# Patient Record
Sex: Female | Born: 1980 | Hispanic: Yes | Marital: Married | Smoking: Never smoker
Health system: Southern US, Community
[De-identification: ages and names within clinical notes are randomized; demographics above are authoritative.]

## PROBLEM LIST (undated history)

## (undated) DIAGNOSIS — Q211 Atrial septal defect, unspecified: Secondary | ICD-10-CM

## (undated) DIAGNOSIS — K219 Gastro-esophageal reflux disease without esophagitis: Secondary | ICD-10-CM

## (undated) DIAGNOSIS — I639 Cerebral infarction, unspecified: Secondary | ICD-10-CM

## (undated) HISTORY — PX: ASD REPAIR: SHX258

---

## 2013-03-10 ENCOUNTER — Emergency Department (HOSPITAL_COMMUNITY): Payer: Medicaid Other

## 2013-03-10 ENCOUNTER — Encounter (HOSPITAL_COMMUNITY): Payer: Self-pay | Admitting: Emergency Medicine

## 2013-03-10 ENCOUNTER — Emergency Department (HOSPITAL_COMMUNITY)
Admission: EM | Admit: 2013-03-10 | Discharge: 2013-03-10 | Disposition: A | Discharge status: Home / Self Care | Payer: Medicaid Other | Attending: Emergency Medicine | Admitting: Emergency Medicine

## 2013-03-10 DIAGNOSIS — R1013 Epigastric pain: Secondary | ICD-10-CM | POA: Insufficient documentation

## 2013-03-10 DIAGNOSIS — R0602 Shortness of breath: Secondary | ICD-10-CM | POA: Insufficient documentation

## 2013-03-10 DIAGNOSIS — K8020 Calculus of gallbladder without cholecystitis without obstruction: Secondary | ICD-10-CM

## 2013-03-10 DIAGNOSIS — K8050 Calculus of bile duct without cholangitis or cholecystitis without obstruction: Secondary | ICD-10-CM

## 2013-03-10 DIAGNOSIS — R112 Nausea with vomiting, unspecified: Secondary | ICD-10-CM | POA: Insufficient documentation

## 2013-03-10 DIAGNOSIS — K219 Gastro-esophageal reflux disease without esophagitis: Secondary | ICD-10-CM | POA: Insufficient documentation

## 2013-03-10 DIAGNOSIS — Z8673 Personal history of transient ischemic attack (TIA), and cerebral infarction without residual deficits: Secondary | ICD-10-CM | POA: Insufficient documentation

## 2013-03-10 DIAGNOSIS — Z8679 Personal history of other diseases of the circulatory system: Secondary | ICD-10-CM | POA: Insufficient documentation

## 2013-03-10 DIAGNOSIS — R42 Dizziness and giddiness: Secondary | ICD-10-CM | POA: Insufficient documentation

## 2013-03-10 HISTORY — DX: Cerebral infarction, unspecified: I63.9

## 2013-03-10 HISTORY — DX: Gastro-esophageal reflux disease without esophagitis: K21.9

## 2013-03-10 HISTORY — DX: Atrial septal defect, unspecified: Q21.10

## 2013-03-10 HISTORY — DX: Atrial septal defect: Q21.1

## 2013-03-10 LAB — CBC WITH DIFFERENTIAL/PLATELET
BASOS PCT: 0 % (ref 0–1)
Basophils Absolute: 0 10*3/uL (ref 0.0–0.1)
EOS ABS: 0 10*3/uL (ref 0.0–0.7)
Eosinophils Relative: 0 % (ref 0–5)
HCT: 36.2 % (ref 36.0–46.0)
Hemoglobin: 12.5 g/dL (ref 12.0–15.0)
Lymphocytes Relative: 4 % — ABNORMAL LOW (ref 12–46)
Lymphs Abs: 0.5 10*3/uL — ABNORMAL LOW (ref 0.7–4.0)
MCH: 28.9 pg (ref 26.0–34.0)
MCHC: 34.5 g/dL (ref 30.0–36.0)
MCV: 83.6 fL (ref 78.0–100.0)
Monocytes Absolute: 0.7 10*3/uL (ref 0.1–1.0)
Monocytes Relative: 6 % (ref 3–12)
NEUTROS ABS: 10.2 10*3/uL — AB (ref 1.7–7.7)
Neutrophils Relative %: 89 % — ABNORMAL HIGH (ref 43–77)
PLATELETS: 218 10*3/uL (ref 150–400)
RBC: 4.33 MIL/uL (ref 3.87–5.11)
RDW: 13.3 % (ref 11.5–15.5)
WBC: 11.4 10*3/uL — AB (ref 4.0–10.5)

## 2013-03-10 LAB — COMPREHENSIVE METABOLIC PANEL
ALT: 17 U/L (ref 0–35)
AST: 17 U/L (ref 0–37)
Albumin: 3.5 g/dL (ref 3.5–5.2)
Alkaline Phosphatase: 63 U/L (ref 39–117)
BILIRUBIN TOTAL: 0.5 mg/dL (ref 0.3–1.2)
BUN: 13 mg/dL (ref 6–23)
CALCIUM: 8.2 mg/dL — AB (ref 8.4–10.5)
CO2: 22 meq/L (ref 19–32)
Chloride: 104 mEq/L (ref 96–112)
Creatinine, Ser: 0.7 mg/dL (ref 0.50–1.10)
Glucose, Bld: 112 mg/dL — ABNORMAL HIGH (ref 70–99)
Potassium: 3.7 mEq/L (ref 3.7–5.3)
Sodium: 141 mEq/L (ref 137–147)
Total Protein: 6.5 g/dL (ref 6.0–8.3)

## 2013-03-10 LAB — POCT I-STAT TROPONIN I: Troponin i, poc: 0 ng/mL (ref 0.00–0.08)

## 2013-03-10 LAB — LIPASE, BLOOD: LIPASE: 28 U/L (ref 11–59)

## 2013-03-10 MED ORDER — ONDANSETRON HCL 4 MG/2ML IJ SOLN
4.0000 mg | Freq: Once | INTRAMUSCULAR | Status: AC
  Administered 2013-03-10: 4 mg via INTRAVENOUS
  Filled 2013-03-10: qty 2

## 2013-03-10 MED ORDER — OMEPRAZOLE 20 MG PO CPDR: 20.0000 mg | DELAYED_RELEASE_CAPSULE | Freq: Every day | ORAL | Status: DC

## 2013-03-10 MED ORDER — METOCLOPRAMIDE HCL 10 MG PO TABS: 10.0000 mg | ORAL_TABLET | Freq: Four times a day (QID) | ORAL | Status: DC | PRN

## 2013-03-10 MED ORDER — TRAMADOL HCL 50 MG PO TABS: 50.0000 mg | ORAL_TABLET | Freq: Four times a day (QID) | ORAL | Status: DC | PRN

## 2013-03-10 MED ORDER — ASPIRIN 325 MG PO TABS
325.0000 mg | ORAL_TABLET | ORAL | Status: AC
  Administered 2013-03-10: 325 mg via ORAL
  Filled 2013-03-10: qty 1

## 2013-03-10 MED ORDER — MORPHINE SULFATE 4 MG/ML IJ SOLN
4.0000 mg | Freq: Once | INTRAMUSCULAR | Status: AC
  Administered 2013-03-10: 4 mg via INTRAVENOUS
  Filled 2013-03-10: qty 1

## 2013-03-10 NOTE — ED Notes (Signed)
Here by EMS from home, new to [place], use to live in [place], moved to [place] for 1 year. Husband out of town,  baby with pt (mother), baby bottle fed and breast fed. Pt here for CP, sob, nv, dizziness. Onset 2300. (denies fever or diarrhea).  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 Also h/o GERD with hospitalization for same aroudn time of pregnancy. Does not take any heart meds (or lasix). Dizziness and sob improved. Rates CP 8/10, NSL by EMS placed, zofran given. Pt pale, skin clammy. Last ate 2000 (bread, butter, milk). Pinpoints pain to R chest and epigastric mid upper abd.

## 2013-03-10 NOTE — Discharge Instructions (Signed)
Stay on a low fat diet until you see the surgeon.  Cholelithiasis Cholelithiasis (also called gallstones) is a form of gallbladder disease in which gallstones form in your gallbladder. The gallbladder is an organ that stores bile made in the liver, which helps digest fats. Gallstones begin as small crystals and slowly grow into stones. Gallstone pain occurs when the gallbladder spasms and a gallstone is blocking the duct. Pain can also occur when a stone passes out of the duct.  RISK FACTORS  Being female.   Having multiple pregnancies. Health care providers sometimes advise removing diseased gallbladders before future pregnancies.   Being obese.  Eating a diet heavy in fried foods and fat.   Being older than 16 years and increasing age.   Prolonged use of medicines containing female hormones.   Having diabetes mellitus.   Rapidly losing weight.   Having a family history of gallstones (heredity).  SYMPTOMS  Nausea.   Vomiting.  Abdominal pain.   Yellowing of the skin (jaundice).   Sudden pain. It may persist from several minutes to several hours.  Fever.   Tenderness to the touch. In some cases, when gallstones do not move into the bile duct, people have no pain or symptoms. These are called "silent" gallstones.  TREATMENT Silent gallstones do not need treatment. In severe cases, emergency surgery may be required. Options for treatment include:  Surgery to remove the gallbladder. This is the most common treatment.  Medicines. These do not always work and may take 6 12 months or more to work.  Shock wave treatment (extracorporeal biliary lithotripsy). In this treatment an ultrasound machine sends shock waves to the gallbladder to break gallstones into smaller pieces that can pass into the intestines or be dissolved by medicine. HOME CARE INSTRUCTIONS   Only take over-the-counter or prescription medicines for pain, discomfort, or fever as directed by your  health care provider.   Follow a low-fat diet until seen again by your health care provider. Fat causes the gallbladder to contract, which can result in pain.   Follow up with your health care provider as directed. Attacks are almost always recurrent and surgery is usually required for permanent treatment.  SEEK IMMEDIATE MEDICAL CARE IF:   Your pain increases and is not controlled by medicines.   You have a fever or persistent symptoms for more than 2 3 days.   You have a fever and your symptoms suddenly get worse.   You have persistent nausea and vomiting.  MAKE SURE YOU:   Understand these instructions.  Will watch your condition.  Will get help right away if you are not doing well or get worse. Document Released: 01/19/2005 Document Revised: 09/25/2012 Document Reviewed: 07/17/2012 [facility] Patient Information 2014 [place].  Tramadol tablets What is this medicine? TRAMADOL (TRA ma dole) is a pain reliever. It is used to treat moderate to severe pain in adults. This medicine may be used for other purposes; ask your health care provider or pharmacist if you have questions. COMMON BRAND NAME(S): Ultram What should I tell my health care provider before I take this medicine? They need to know if you have any of these conditions: -brain tumor -depression -drug abuse or addiction -head injury -if you frequently drink alcohol containing drinks -kidney disease or trouble passing urine -liver disease -lung disease, asthma, or breathing problems -seizures or epilepsy -suicidal thoughts, plans, or attempt; a previous suicide attempt by you or a family member -an unusual or allergic reaction to tramadol, codeine,  other medicines, foods, dyes, or preservatives -pregnant or trying to get pregnant -breast-feeding How should I use this medicine? Take this medicine by mouth with a full glass of water. Follow the directions on the prescription label. If the medicine  upsets your stomach, take it with food or milk. Do not take more medicine than you are told to take. Talk to your pediatrician regarding the use of this medicine in children. Special care may be needed. Overdosage: If you think you have taken too much of this medicine contact a poison control center or emergency room at once. NOTE: This medicine is only for you. Do not share this medicine with others. What if I miss a dose? If you miss a dose, take it as soon as you can. If it is almost time for your next dose, take only that dose. Do not take double or extra doses. What may interact with this medicine? Do not take this medicine with any of the following medications: -MAOIs like Carbex, Eldepryl, Marplan, Nardil, and Parnate This medicine may also interact with the following medications: -alcohol or medicines that contain alcohol -antihistamines -benzodiazepines -bupropion -carbamazepine or oxcarbazepine -clozapine -cyclobenzaprine -digoxin -furazolidone -linezolid -medicines for depression, anxiety, or psychotic disturbances -medicines for migraine headache like almotriptan, eletriptan, frovatriptan, naratriptan, rizatriptan, sumatriptan, zolmitriptan -medicines for pain like pentazocine, buprenorphine, butorphanol, meperidine, nalbuphine, and propoxyphene -medicines for sleep -muscle relaxants -naltrexone -phenobarbital -phenothiazines like perphenazine, thioridazine, chlorpromazine, mesoridazine, fluphenazine, prochlorperazine, promazine, and trifluoperazine -procarbazine -warfarin This list may not describe all possible interactions. Give your health care provider a list of all the medicines, herbs, non-prescription drugs, or dietary supplements you use. Also tell them if you smoke, drink alcohol, or use illegal drugs. Some items may interact with your medicine. What should I watch for while using this medicine? Tell your doctor or health care professional if your pain does not go  away, if it gets worse, or if you have new or a different type of pain. You may develop tolerance to the medicine. Tolerance means that you will need a higher dose of the medicine for pain relief. Tolerance is normal and is expected if you take this medicine for a long time. Do not suddenly stop taking your medicine because you may develop a severe reaction. Your body becomes used to the medicine. This does NOT mean you are addicted. Addiction is a behavior related to getting and using a drug for a non-medical reason. If you have pain, you have a medical reason to take pain medicine. Your doctor will tell you how much medicine to take. If your doctor wants you to stop the medicine, the dose will be slowly lowered over time to avoid any side effects. You may get drowsy or dizzy. Do not drive, use machinery, or do anything that needs mental alertness until you know how this medicine affects you. Do not stand or sit up quickly, especially if you are an older patient. This reduces the risk of dizzy or fainting spells. Alcohol can increase or decrease the effects of this medicine. Avoid alcoholic drinks. You may have constipation. Try to have a bowel movement at least every 2 to 3 days. If you do not have a bowel movement for 3 days, call your doctor or health care professional. Your mouth may get dry. Chewing sugarless gum or sucking hard candy, and drinking plenty of water may help. Contact your doctor if the problem does not go away or is severe. What side effects may I notice from receiving  this medicine? Side effects that you should report to your doctor or health care professional as soon as possible: -allergic reactions like skin rash, itching or hives, swelling of the face, lips, or tongue -breathing difficulties, wheezing -confusion -itching -light headedness or fainting spells -redness, blistering, peeling or loosening of the skin, including inside the mouth -seizures Side effects that usually do  not require medical attention (report to your doctor or health care professional if they continue or are bothersome): -constipation -dizziness -drowsiness -headache -nausea, vomiting This list may not describe all possible side effects. Call your doctor for medical advice about side effects. You may report side effects to FDA at 1-800-FDA-1088. Where should I keep my medicine? Keep out of the reach of children. Store at room temperature between 15 and 30 degrees C (59 and 86 degrees F). Keep container tightly closed. Throw away any unused medicine after the expiration date. NOTE: This sheet is a summary. It may not cover all possible information. If you have questions about this medicine, talk to your doctor, pharmacist, or health care provider.  2014, Elsevier/Gold Standard. (2009-10-06 11:55:44)  Metoclopramide tablets What is this medicine? METOCLOPRAMIDE (met oh kloe PRA mide) is used to treat the symptoms of gastroesophageal reflux disease (GERD) like heartburn. It is also used to treat people with slow emptying of the stomach and intestinal tract. This medicine may be used for other purposes; ask your health care provider or pharmacist if you have questions. COMMON BRAND NAME(S): Reglan What should I tell my health care provider before I take this medicine? They need to know if you have any of these conditions: -breast cancer -depression -diabetes -heart failure -high blood pressure -kidney disease -liver disease -Parkinson's disease or a movement disorder -pheochromocytoma -seizures -stomach obstruction, bleeding, or perforation -an unusual or allergic reaction to metoclopramide, procainamide, sulfites, other medicines, foods, dyes, or preservatives -pregnant or trying to get pregnant -breast-feeding How should I use this medicine? Take this medicine by mouth with a glass of water. Follow the directions on the prescription label. Take this medicine on an empty stomach,  about 30 minutes before eating. Take your doses at regular intervals. Do not take your medicine more often than directed. Do not stop taking except on the advice of your doctor or health care professional. A special MedGuide will be given to you by the pharmacist with each prescription and refill. Be sure to read this information carefully each time. Talk to your pediatrician regarding the use of this medicine in children. Special care may be needed. Overdosage: If you think you have taken too much of this medicine contact a poison control center or emergency room at once. NOTE: This medicine is only for you. Do not share this medicine with others. What if I miss a dose? If you miss a dose, take it as soon as you can. If it is almost time for your next dose, take only that dose. Do not take double or extra doses. What may interact with this medicine? -acetaminophen -cyclosporine -digoxin -medicines for blood pressure -medicines for diabetes, including insulin -medicines for hay fever and other allergies -medicines for depression, especially an Monoamine Oxidase Inhibitor (MAOI) -medicines for Parkinson's disease, like levodopa -medicines for sleep or for pain -tetracycline This list may not describe all possible interactions. Give your health care provider a list of all the medicines, herbs, non-prescription drugs, or dietary supplements you use. Also tell them if you smoke, drink alcohol, or use illegal drugs. Some items may interact with  your medicine. What should I watch for while using this medicine? It may take a few weeks for your stomach condition to start to get better. However, do not take this medicine for longer than 12 weeks. The longer you take this medicine, and the more you take it, the greater your chances are of developing serious side effects. If you are an elderly patient, a female patient, or you have diabetes, you may be at an increased risk for side effects from this  medicine. Contact your doctor immediately if you start having movements you cannot control such as lip smacking, rapid movements of the tongue, involuntary or uncontrollable movements of the eyes, head, arms and legs, or muscle twitches and spasms. Patients and their families should watch out for worsening depression or thoughts of suicide. Also watch out for any sudden or severe changes in feelings such as feeling anxious, agitated, panicky, irritable, hostile, aggressive, impulsive, severely restless, overly excited and hyperactive, or not being able to sleep. If this happens, especially at the beginning of treatment or after a change in dose, call your doctor. Do not treat yourself for high fever. Ask your doctor or health care professional for advice. You may get drowsy or dizzy. Do not drive, use machinery, or do anything that needs mental alertness until you know how this drug affects you. Do not stand or sit up quickly, especially if you are an older patient. This reduces the risk of dizzy or fainting spells. Alcohol can make you more drowsy and dizzy. Avoid alcoholic drinks. What side effects may I notice from receiving this medicine? Side effects that you should report to your doctor or health care professional as soon as possible: -allergic reactions like skin rash, itching or hives, swelling of the face, lips, or tongue -abnormal production of milk in females -breast enlargement in both males and females -change in the way you walk -difficulty moving, speaking or swallowing -drooling, lip smacking, or rapid movements of the tongue -excessive sweating -fever -involuntary or uncontrollable movements of the eyes, head, arms and legs -irregular heartbeat or palpitations -muscle twitches and spasms -unusually weak or tired Side effects that usually do not require medical attention (report to your doctor or health care professional if they continue or are bothersome): -change in sex drive or  performance -depressed mood -diarrhea -difficulty sleeping -headache -menstrual changes -restless or nervous This list may not describe all possible side effects. Call your doctor for medical advice about side effects. You may report side effects to FDA at 1-800-FDA-1088. Where should I keep my medicine? Keep out of the reach of children. Store at room temperature between 20 and 25 degrees C (68 and 77 degrees F). Protect from light. Keep container tightly closed. Throw away any unused medicine after the expiration date. NOTE: This sheet is a summary. It may not cover all possible information. If you have questions about this medicine, talk to your doctor, pharmacist, or health care provider.  2014, Elsevier/Gold Standard. (2011-05-23 13:04:38)

## 2013-03-10 NOTE — ED Notes (Signed)
Pt sleeping, baby sleeping, [facility] No changes.

## 2013-03-10 NOTE — ED Notes (Signed)
Husband returned call, aware of pt/ wife (& daughter) situation, he is in [place] about "2 hrs away", he is on the way here to ED.

## 2013-03-10 NOTE — ED Provider Notes (Signed)
CSN: [id number]     Arrival date & time 03/10/13  0127 History   First MD Initiated Contact with Patient 03/10/13 0241     Chief Complaint  Patient presents with  . Chest Pain  . Shortness of Breath  . Emesis  . Dizziness   (Consider location/radiation/quality/duration/timing/severity/associated sxs/prior Treatment) Patient is a 33 y.o. female presenting with chest pain, shortness of breath, vomiting, and dizziness. The history is provided by the patient.  Chest Pain Associated symptoms: dizziness, shortness of breath and vomiting   Shortness of Breath Associated symptoms: chest pain and vomiting   Emesis Dizziness Associated symptoms: chest pain, shortness of breath and vomiting   She noticed onset about 11:00 PM of epigastric pain with some radiation to back. Pain is dull and crampy. There is associated nausea and vomiting but no dyspnea or diaphoresis. Pain did not improve after vomiting. Pain was initially 10/10 but it has subsided to 6/10. She has not had pain like this before. She had eaten some bread with butter and milk about 3 hours before this started. She is 9 months postpartum.  Past Medical History  Diagnosis Date  . Gestational diabetes   . ASD (atrial septal defect)   . Stroke   . GERD (gastroesophageal reflux disease)    Past Surgical History  Procedure Laterality Date  . Asd repair     No family history on file. History  Substance Use Topics  . Smoking status: Never Smoker   . Smokeless tobacco: Not on file  . Alcohol Use: No   OB History   Grav Para Term Preterm Abortions TAB SAB Ect Mult Living                 Review of Systems  Respiratory: Positive for shortness of breath.   Cardiovascular: Positive for chest pain.  Gastrointestinal: Positive for vomiting.  Neurological: Positive for dizziness.  All other systems reviewed and are negative.    Allergies  Review of patient's allergies indicates no known allergies.  Home Medications  No  current outpatient prescriptions on file. BP 114/74  Pulse 60  Temp(Src) 99.1 F (37.3 C) (Oral)  Resp 12  SpO2 96%  LMP 02/24/2013 Physical Exam  Nursing note and vitals reviewed.  33 year old female, resting comfortably and in no acute distress. Vital signs are normal. Oxygen saturation is 96%, which is normal. Head is normocephalic and atraumatic. PERRLA, EOMI. Oropharynx is clear. Neck is nontender and supple without adenopathy or JVD. Back is nontender and there is no CVA tenderness. Lungs are clear without rales, wheezes, or rhonchi. Chest is nontender. Heart has regular rate and rhythm without murmur. Abdomen is soft, flat, with marked epigastric and right upper quadrant tenderness with positive Murphy's sign. There are no masses or hepatosplenomegaly and peristalsis is hypoactive. Extremities have no cyanosis or edema, full range of motion is present. Skin is warm and dry without rash. Neurologic: Mental status is normal, cranial nerves are intact, there are no motor or sensory deficits.   ED Course  Procedures (including critical care time) Labs Review Results for orders placed during the hospital encounter of 03/10/13  COMPREHENSIVE METABOLIC PANEL      Result Value Range   Sodium 141  137 - 147 mEq/L   Potassium 3.7  3.7 - 5.3 mEq/L   Chloride 104  96 - 112 mEq/L   CO2 22  19 - 32 mEq/L   Glucose, Bld 112 (*) 70 - 99 mg/dL   BUN 13  6 - 23 mg/dL   Creatinine, Ser 1.610.70  0.50 - 1.10 mg/dL   Calcium 8.2 (*) 8.4 - 10.5 mg/dL   Total Protein 6.5  6.0 - 8.3 g/dL   Albumin 3.5  3.5 - 5.2 g/dL   AST 17  0 - 37 U/L   ALT 17  0 - 35 U/L   Alkaline Phosphatase 63  39 - 117 U/L   Total Bilirubin 0.5  0.3 - 1.2 mg/dL   GFR calc non Af Amer >90  >90 mL/min   GFR calc Af Amer >90  >90 mL/min  CBC WITH DIFFERENTIAL      Result Value Range   WBC 11.4 (*) 4.0 - 10.5 K/uL   RBC 4.33  3.87 - 5.11 MIL/uL   Hemoglobin 12.5  12.0 - 15.0 g/dL   HCT 09.636.2  04.536.0 - 40.946.0 %   MCV 83.6   78.0 - 100.0 fL   MCH 28.9  26.0 - 34.0 pg   MCHC 34.5  30.0 - 36.0 g/dL   RDW 81.113.3  91.411.5 - 78.215.5 %   Platelets 218  150 - 400 K/uL   Neutrophils Relative % 89 (*) 43 - 77 %   Neutro Abs 10.2 (*) 1.7 - 7.7 K/uL   Lymphocytes Relative 4 (*) 12 - 46 %   Lymphs Abs 0.5 (*) 0.7 - 4.0 K/uL   Monocytes Relative 6  3 - 12 %   Monocytes Absolute 0.7  0.1 - 1.0 K/uL   Eosinophils Relative 0  0 - 5 %   Eosinophils Absolute 0.0  0.0 - 0.7 K/uL   Basophils Relative 0  0 - 1 %   Basophils Absolute 0.0  0.0 - 0.1 K/uL  LIPASE, BLOOD      Result Value Range   Lipase 28  11 - 59 U/L  POCT I-STAT TROPONIN I      Result Value Range   Troponin i, poc 0.00  0.00 - 0.08 ng/mL   Comment 3            Imaging Review [place] Abdomen Complete  03/10/2013   CLINICAL DATA:  Abdominal pain  EXAM: ULTRASOUND ABDOMEN COMPLETE  COMPARISON:  None.  FINDINGS: Gallbladder:  There is a 5 mm echogenic structure which is reportedly mobile in the gallbladder neck. Although not shadowing, the discrete appearance is consistent with stone. No wall thickening or sonographic Murphy sign.  Common bile duct:  Diameter: 4 mm  Liver:  No focal lesion identified. Within normal limits in parenchymal echogenicity.  IVC:  No abnormality visualized.  Pancreas:  Not visualized due to bowel gas  Spleen:  Size and appearance within normal limits.  Right Kidney:  Length: 10 cm. Echogenicity within normal limits. No mass or hydronephrosis visualized.  Left Kidney:  Length: 10 cm. Echogenicity within normal limits. No mass or hydronephrosis visualized.  Abdominal aorta:  No aneurysm visualized.  Other findings:  None.  IMPRESSION: Cholelithiasis without acute cholecystitis.   Electronically Signed   By: Tiburcio PeaJonathan  Watts M.D.   On: 03/10/2013 04:50   Dg Chest Port 1 View  03/10/2013   CLINICAL DATA:  Chest pain, shortness of breath, nausea and vomiting.  EXAM: PORTABLE CHEST - 1 VIEW  COMPARISON:  None.  FINDINGS: Normal heart size and mediastinal  contours. No acute infiltrate or edema. No effusion or pneumothorax. No acute osseous findings.  IMPRESSION: Normal chest radiograph.   Electronically Signed   By: Tiburcio PeaJonathan  Watts M.D.   On: 03/10/2013  02:46   Images viewed by me.  MDM   1. Biliary colic   2. Cholelithiasis    Epigastric and right upper quadrant pain which seems most likely to be biliary colic. She is in the postpartum state which is at time when biliary colic is frequently a problem. Because she is currently breast-feeding, she was offered the option of narcotic pain medication with the provision that she would have to pump and discard milk for for 24 hours. She is currently deciding not to take pain medication. She will be sent for ultrasound of her abdomen. She has no prior records and the [place] system.  Ultrasound confirms a small stone in the gallbladder and this appears to be what is the source of her pain. She is referred to [facility] [place] [facility] for followup and is discharged with instructions in the low fat diet and prescriptions are given for metoclopramide for nausea and tramadol for pain. Advised that she should not breast feed for 24 hours after taking a narcotic.  Dione Booze, MD 03/10/13 0500

## 2013-03-10 NOTE — ED Notes (Signed)
US i to room for BS portable US.

## 2013-03-10 NOTE — ED Notes (Signed)
Pain is worse, pt tearful, lying on R lateral recumbent, NAD. Husband's cell phone called x2, message left x2, awaiting call back. Pt notified. Baby sleeping next to mother. [facility]IR. [facility]

## 2013-03-10 NOTE — ED Notes (Signed)
US tech contacted, [facility] US will be ~ 45 minutes, staff and mother attempting to console baby. Pt declined pain med at this time.

## 2013-03-10 NOTE — ED Notes (Signed)
Baby sleeping w/pt  Pt aware that husband is on the way. Pt states that  She is worried that she will get home and then have to come back and get admitted agagin like last time

## 2013-03-10 NOTE — ED Notes (Signed)
Dr. Preston FleetingGlick into room, baby crying, NS holding.

## 2013-05-08 ENCOUNTER — Emergency Department (HOSPITAL_COMMUNITY): Payer: Medicaid Other

## 2013-05-08 ENCOUNTER — Emergency Department (HOSPITAL_COMMUNITY)
Admission: EM | Admit: 2013-05-08 | Discharge: 2013-05-08 | Disposition: A | Discharge status: Home / Self Care | Payer: Medicaid Other | Attending: Emergency Medicine | Admitting: Emergency Medicine

## 2013-05-08 ENCOUNTER — Encounter (HOSPITAL_COMMUNITY): Payer: Self-pay | Admitting: Emergency Medicine

## 2013-05-08 DIAGNOSIS — Z3202 Encounter for pregnancy test, result negative: Secondary | ICD-10-CM | POA: Insufficient documentation

## 2013-05-08 DIAGNOSIS — Z8632 Personal history of gestational diabetes: Secondary | ICD-10-CM | POA: Insufficient documentation

## 2013-05-08 DIAGNOSIS — Z8719 Personal history of other diseases of the digestive system: Secondary | ICD-10-CM | POA: Insufficient documentation

## 2013-05-08 DIAGNOSIS — Z8774 Personal history of (corrected) congenital malformations of heart and circulatory system: Secondary | ICD-10-CM | POA: Insufficient documentation

## 2013-05-08 DIAGNOSIS — R1011 Right upper quadrant pain: Secondary | ICD-10-CM | POA: Insufficient documentation

## 2013-05-08 DIAGNOSIS — Z8673 Personal history of transient ischemic attack (TIA), and cerebral infarction without residual deficits: Secondary | ICD-10-CM | POA: Insufficient documentation

## 2013-05-08 DIAGNOSIS — R1013 Epigastric pain: Secondary | ICD-10-CM

## 2013-05-08 LAB — URINALYSIS, ROUTINE W REFLEX MICROSCOPIC
BILIRUBIN URINE: NEGATIVE
Glucose, UA: NEGATIVE mg/dL
HGB URINE DIPSTICK: NEGATIVE
Ketones, ur: NEGATIVE mg/dL
Leukocytes, UA: NEGATIVE
NITRITE: NEGATIVE
Protein, ur: NEGATIVE mg/dL
SPECIFIC GRAVITY, URINE: 1.025 (ref 1.005–1.030)
Urobilinogen, UA: 0.2 mg/dL (ref 0.0–1.0)
pH: 6 (ref 5.0–8.0)

## 2013-05-08 LAB — CBC WITH DIFFERENTIAL/PLATELET
BASOS PCT: 0 % (ref 0–1)
Basophils Absolute: 0 10*3/uL (ref 0.0–0.1)
EOS ABS: 0.1 10*3/uL (ref 0.0–0.7)
Eosinophils Relative: 1 % (ref 0–5)
HCT: 36.7 % (ref 36.0–46.0)
Hemoglobin: 12.6 g/dL (ref 12.0–15.0)
Lymphocytes Relative: 7 % — ABNORMAL LOW (ref 12–46)
Lymphs Abs: 1 10*3/uL (ref 0.7–4.0)
MCH: 29.1 pg (ref 26.0–34.0)
MCHC: 34.3 g/dL (ref 30.0–36.0)
MCV: 84.8 fL (ref 78.0–100.0)
Monocytes Absolute: 1.3 10*3/uL — ABNORMAL HIGH (ref 0.1–1.0)
Monocytes Relative: 9 % (ref 3–12)
NEUTROS PCT: 83 % — AB (ref 43–77)
Neutro Abs: 11.1 10*3/uL — ABNORMAL HIGH (ref 1.7–7.7)
Platelets: 207 10*3/uL (ref 150–400)
RBC: 4.33 MIL/uL (ref 3.87–5.11)
RDW: 13.4 % (ref 11.5–15.5)
WBC: 13.4 10*3/uL — ABNORMAL HIGH (ref 4.0–10.5)

## 2013-05-08 LAB — COMPREHENSIVE METABOLIC PANEL
ALBUMIN: 3.4 g/dL — AB (ref 3.5–5.2)
ALK PHOS: 65 U/L (ref 39–117)
ALT: 23 U/L (ref 0–35)
AST: 16 U/L (ref 0–37)
BUN: 17 mg/dL (ref 6–23)
CO2: 23 mEq/L (ref 19–32)
Calcium: 8.9 mg/dL (ref 8.4–10.5)
Chloride: 102 mEq/L (ref 96–112)
Creatinine, Ser: 0.64 mg/dL (ref 0.50–1.10)
GFR calc Af Amer: >90 mL/min (ref >90)
GFR calc non Af Amer: >90 mL/min (ref >90)
Glucose, Bld: 104 mg/dL — ABNORMAL HIGH (ref 70–99)
POTASSIUM: 3.8 meq/L (ref 3.7–5.3)
SODIUM: 138 meq/L (ref 137–147)
Total Bilirubin: 0.3 mg/dL (ref 0.3–1.2)
Total Protein: 6.6 g/dL (ref 6.0–8.3)

## 2013-05-08 LAB — LIPASE, BLOOD: Lipase: 25 U/L (ref 11–59)

## 2013-05-08 LAB — PREGNANCY, URINE: Preg Test, Ur: NEGATIVE

## 2013-05-08 MED ORDER — HYDROCODONE-ACETAMINOPHEN 5-325 MG PO TABS: 1.0000 | ORAL_TABLET | Freq: Four times a day (QID) | ORAL | Status: DC | PRN

## 2013-05-08 MED ORDER — IOHEXOL 300 MG/ML  SOLN
100.0000 mL | Freq: Once | INTRAMUSCULAR | Status: AC | PRN
  Administered 2013-05-08: 100 mL via INTRAVENOUS

## 2013-05-08 MED ORDER — MORPHINE SULFATE 4 MG/ML IJ SOLN
4.0000 mg | Freq: Once | INTRAMUSCULAR | Status: AC
  Administered 2013-05-08: 4 mg via INTRAVENOUS
  Filled 2013-05-08: qty 1

## 2013-05-08 MED ORDER — IOHEXOL 300 MG/ML  SOLN
25.0000 mL | Freq: Once | INTRAMUSCULAR | Status: AC | PRN
  Administered 2013-05-08: 25 mL via ORAL

## 2013-05-08 MED ORDER — PANTOPRAZOLE SODIUM 40 MG PO TBEC
40.0000 mg | DELAYED_RELEASE_TABLET | Freq: Once | ORAL | Status: AC
  Administered 2013-05-08: 40 mg via ORAL
  Filled 2013-05-08: qty 1

## 2013-05-08 MED ORDER — FAMOTIDINE IN NACL 20-0.9 MG/50ML-% IV SOLN
20.0000 mg | INTRAVENOUS | Status: AC
  Administered 2013-05-08: 20 mg via INTRAVENOUS
  Filled 2013-05-08: qty 50

## 2013-05-08 MED ORDER — RANITIDINE HCL 150 MG PO TABS: 150.0000 mg | ORAL_TABLET | Freq: Two times a day (BID) | ORAL | Status: DC

## 2013-05-08 MED ORDER — GI COCKTAIL ~~LOC~~
30.0000 mL | Freq: Once | ORAL | Status: AC
  Administered 2013-05-08: 30 mL via ORAL
  Filled 2013-05-08: qty 30

## 2013-05-08 MED ORDER — PANTOPRAZOLE SODIUM 40 MG IV SOLR
40.0000 mg | INTRAVENOUS | Status: AC
  Administered 2013-05-08: 40 mg via INTRAVENOUS
  Filled 2013-05-08: qty 40

## 2013-05-08 MED ORDER — ONDANSETRON HCL 4 MG/2ML IJ SOLN
4.0000 mg | Freq: Once | INTRAMUSCULAR | Status: AC
  Administered 2013-05-08: 4 mg via INTRAVENOUS
  Filled 2013-05-08: qty 2

## 2013-05-08 NOTE — ED Notes (Signed)
Per EMS- patient c/o R abdominal pain, already diagnosed with gallbladder issues but patient did not want it out. States she does not want it out at current time either due to child's birthday on Friday. 30 mg Toradol, 4 of Zofran given in route.

## 2013-05-08 NOTE — ED Notes (Signed)
Patient requesting to hold off on nausea medication

## 2013-05-08 NOTE — ED Provider Notes (Signed)
CSN: [id number]     Arrival date & time 05/08/13  0044 History   First MD Initiated Contact with Patient 05/08/13 0118     Chief Complaint  Patient presents with  . Abdominal Pain     (Consider location/radiation/quality/duration/timing/severity/associated sxs/prior Treatment) HPI Comments: Who presents with a complaint of abdominal discomfort. She states that this started earlier this evening approximately 7 hours ago and has been persistent, located in the epigastrium in the mid lower chest with some radiation to the right upper quadrant. She has had some nausea but no vomiting, no radiation to the back. It does get worse with eating, it does get worse with laying supine. She has no fevers chills coughing shortness of breath and has no back pain swelling of the legs dysuria or diarrhea. She was diagnosed with cholelithiasis approximately 6 weeks ago by ultrasound, no signs of cholecystitis with normal blood work at that time. She has not had any symptoms since being seen in the emergency department in February until this evening. He does not drink alcohol, she has had nothing but cereal and salad over the last 14 hours.  Patient is a 33 y.o. female presenting with abdominal pain. The history is provided by the patient and medical records.  Abdominal Pain   Past Medical History  Diagnosis Date  . Gestational diabetes   . ASD (atrial septal defect)   . Stroke   . GERD (gastroesophageal reflux disease)    Past Surgical History  Procedure Laterality Date  . Asd repair     History reviewed. No pertinent family history. History  Substance Use Topics  . Smoking status: Never Smoker   . Smokeless tobacco: Not on file  . Alcohol Use: No   OB History   Grav Para Term Preterm Abortions TAB SAB Ect Mult Living                 Review of Systems  Gastrointestinal: Positive for abdominal pain.  All other systems reviewed and are negative.      Allergies  Peanut butter flavor  Home  Medications  No current outpatient prescriptions on file. BP 127/61  Pulse 92  Temp(Src) 97.6 F (36.4 C) (Oral)  Resp 20  Wt 170 lb (77.111 kg)  SpO2 95%  LMP 04/21/2013 Physical Exam  Nursing note and vitals reviewed. Constitutional: She appears well-developed and well-nourished. No distress.  HENT:  Head: Normocephalic and atraumatic.  Mouth/Throat: Oropharynx is clear and moist. No oropharyngeal exudate.  Eyes: Conjunctivae and EOM are normal. Pupils are equal, round, and reactive to light. Right eye exhibits no discharge. Left eye exhibits no discharge. No scleral icterus.  Neck: Normal range of motion. Neck supple. No JVD present. No thyromegaly present.  Cardiovascular: Normal rate, regular rhythm, normal heart sounds and intact distal pulses.  Exam reveals no gallop and no friction rub.   No murmur heard. Pulmonary/Chest: Effort normal and breath sounds normal. No respiratory distress. She has no wheezes. She has no rales.  Abdominal: Soft. Bowel sounds are normal. She exhibits no distension and no mass. There is tenderness ( mild RUQ and epigastric ttp, no guarding, normal BS).  Musculoskeletal: Normal range of motion. She exhibits no edema and no tenderness.  Lymphadenopathy:    She has no cervical adenopathy.  Neurological: She is alert. Coordination normal.  Skin: Skin is warm and dry. No rash noted. No erythema.  Psychiatric: She has a normal mood and affect. Her behavior is normal.    ED Course  Procedures (including critical care time) Labs Review Labs Reviewed  CBC WITH DIFFERENTIAL  COMPREHENSIVE METABOLIC PANEL  LIPASE, BLOOD  URINALYSIS, ROUTINE W REFLEX MICROSCOPIC  PREGNANCY, URINE   Imaging Review No results found.   EKG Interpretation None      MDM   Final diagnoses:  None    Well appearing female, labs show leukocytosis of 13,400, lipase is normal, liver function is normal, electrolytes are normal. Ultrasound pending  Change of shift -  care signed out to Dr. Gwendolyn GrantWalden.     Vida RollerBrian D Cesia Orf, MD 05/09/13 [phone]

## 2013-05-08 NOTE — ED Notes (Signed)
Ultrasound still at bedside. Pt has not voided yet.

## 2013-05-08 NOTE — ED Notes (Signed)
Ultrasound at bedside

## 2013-05-08 NOTE — ED Notes (Signed)
Patient requesting nausea medication 

## 2013-05-08 NOTE — ED Notes (Signed)
Patient still drinking contrast.

## 2013-05-08 NOTE — ED Provider Notes (Signed)
80F with epigastric pain. Normal labs. Unable to control pain with GI cocktail, PPI. Dr. Hyacinth MeekerMiller ordered CT scan, awaiting results. CT negative for acute findings. Will discharge on H2-blocker and PPI.   1. Epigastric pain    I have reviewed all labs and imaging and considered them in my medical decision making.  CT Abdomen Pelvis W Contrast (Final result)  Result time: 05/08/13 09:35:32    Final result by Rad Results In Interface (05/08/13 09:35:32)    Narrative:   CLINICAL DATA: Abdominal pain  EXAM: CT ABDOMEN AND PELVIS WITH CONTRAST  TECHNIQUE: Multidetector CT imaging of the abdomen and pelvis was performed using the standard protocol following bolus administration of intravenous contrast.  CONTRAST: 100mL OMNIPAQUE IOHEXOL 300 MG/ML SOLN  COMPARISON: Ultrasound abdomen 05/08/2013  FINDINGS: Lung bases are clear. Heart size is within normal limits.  Liver gallbladder and bile ducts are normal. Pancreas spleen and kidneys are normal.  Negative for bowel obstruction. Normal appendix. Sigmoid diverticulosis without evidence of diverticulitis. Minimal free fluid. Corpus luteum cyst on the left. 1 cm enhancing solid uterine mass consistent with leiomyoma in the anterior fundus. No acute bony findings.  IMPRESSION: Sigmoid diverticulosis.  Normal appendix.  Corpus luteum cyst left adnexa.   Electronically Signed By: Marlan Palauharles Clark M.D. On: 05/08/2013 09:35      Dagmar HaitWilliam Exa Bomba, MD 05/08/13 1012

## 2013-05-08 NOTE — Discharge Instructions (Signed)
Abdominal Pain, Women °Abdominal (stomach, pelvic, or belly) pain can be caused by many things. It is important to tell your doctor: °· The location of the pain. °· Does it come and go or is it present all the time? °· Are there things that start the pain (eating certain foods, exercise)? °· Are there other symptoms associated with the pain (fever, nausea, vomiting, diarrhea)? °All of this is helpful to know when trying to find the cause of the pain. °CAUSES  °· Stomach: virus or bacteria infection, or ulcer. °· Intestine: appendicitis (inflamed appendix), regional ileitis (Crohn's disease), ulcerative colitis (inflamed colon), irritable bowel syndrome, diverticulitis (inflamed diverticulum of the colon), or cancer of the stomach or intestine. °· Gallbladder disease or stones in the gallbladder. °· Kidney disease, kidney stones, or infection. °· Pancreas infection or cancer. °· Fibromyalgia (pain disorder). °· Diseases of the female organs: °· Uterus: fibroid (non-cancerous) tumors or infection. °· Fallopian tubes: infection or tubal pregnancy. °· Ovary: cysts or tumors. °· Pelvic adhesions (scar tissue). °· Endometriosis (uterus lining tissue growing in the pelvis and on the pelvic organs). °· Pelvic congestion syndrome (female organs filling up with blood just before the menstrual period). °· Pain with the menstrual period. °· Pain with ovulation (producing an egg). °· Pain with an IUD (intrauterine device, birth control) in the uterus. °· Cancer of the female organs. °· Functional pain (pain not caused by a disease, may improve without treatment). °· Psychological pain. °· Depression. °DIAGNOSIS  °Your doctor will decide the seriousness of your pain by doing an examination. °· Blood tests. °· X-rays. °· Ultrasound. °· CT scan (computed tomography, special type of X-ray). °· MRI (magnetic resonance imaging). °· Cultures, for infection. °· Barium enema (dye inserted in the large intestine, to better view it with  X-rays). °· Colonoscopy (looking in intestine with a lighted tube). °· Laparoscopy (minor surgery, looking in abdomen with a lighted tube). °· Major abdominal exploratory surgery (looking in abdomen with a large incision). °TREATMENT  °The treatment will depend on the cause of the pain.  °· Many cases can be observed and treated at home. °· Over-the-counter medicines recommended by your caregiver. °· Prescription medicine. °· Antibiotics, for infection. °· Birth control pills, for painful periods or for ovulation pain. °· Hormone treatment, for endometriosis. °· Nerve blocking injections. °· Physical therapy. °· Antidepressants. °· Counseling with a psychologist or psychiatrist. °· Minor or major surgery. °HOME CARE INSTRUCTIONS  °· Do not take laxatives, unless directed by your caregiver. °· Take over-the-counter pain medicine only if ordered by your caregiver. Do not take aspirin because it can cause an upset stomach or bleeding. °· Try a clear liquid diet (broth or water) as ordered by your caregiver. Slowly move to a bland diet, as tolerated, if the pain is related to the stomach or intestine. °· Have a thermometer and take your temperature several times a day, and record it. °· Bed rest and sleep, if it helps the pain. °· Avoid sexual intercourse, if it causes pain. °· Avoid stressful situations. °· Keep your follow-up appointments and tests, as your caregiver orders. °· If the pain does not go away with medicine or surgery, you may try: °· Acupuncture. °· Relaxation exercises (yoga, meditation). °· Group therapy. °· Counseling. °SEEK MEDICAL CARE IF:  °· You notice certain foods cause stomach pain. °· Your home care treatment is not helping your pain. °· You need stronger pain medicine. °· You want your IUD removed. °· You feel faint or   lightheaded. °· You develop nausea and vomiting. °· You develop a rash. °· You are having side effects or an allergy to your medicine. °SEEK IMMEDIATE MEDICAL CARE IF:  °· Your  pain does not go away or gets worse. °· You have a fever. °· Your pain is felt only in portions of the abdomen. The right side could possibly be appendicitis. The left lower portion of the abdomen could be colitis or diverticulitis. °· You are passing blood in your stools (bright red or black tarry stools, with or without vomiting). °· You have blood in your urine. °· You develop chills, with or without a fever. °· You pass out. °MAKE SURE YOU:  °· Understand these instructions. °· Will watch your condition. °· Will get help right away if you are not doing well or get worse. °Document Released: 11/20/2006 Document Revised: 04/17/2011 Document Reviewed: 12/10/2008 °ExitCare® Patient Information ©2014 [employer]. ° °Emergency Department Resource Guide °1) Find a Doctor and Pay Out of Pocket °Although you won't have to find out who is covered by your insurance plan, it is a good idea to ask around and get recommendations. You will then need to call the office and see if the doctor you have chosen will accept you as a new patient and what types of options they offer for patients who are self-pay. Some doctors offer discounts or will set up payment plans for their patients who do not have insurance, but you will need to ask so you aren't surprised when you get to your appointment. ° °2) Contact Your Local Health Department °Not all health departments have doctors that can see patients for sick visits, but many do, so it is worth a call to see if yours does. If you don't know where your local health department is, you can check in your phone book. The CDC also has a tool to help you locate your state's health department, and many state websites also have listings of all of their local health departments. ° °3) Find a Walk-in Clinic °If your illness is not likely to be very severe or complicated, you may want to try a walk in clinic. These are popping up all over the country in pharmacies, drugstores, and shopping  centers. They're usually staffed by nurse practitioners or physician assistants that have been trained to treat common illnesses and complaints. They're usually fairly quick and inexpensive. However, if you have serious medical issues or chronic medical problems, these are probably not your best option. ° °No Primary Care Doctor: °- Call [facility] at  832-8000 - they can help you locate a primary care doctor that  accepts your insurance, provides certain services, etc. °- Physician Referral Service- 1-800-533-3463 ° °Chronic Pain Problems: °Organization         Address  Phone   Notes  °[facility]  (336) 297-2271 Patients need to be referred by their primary care doctor.  ° °Medication Assistance: °Organization         Address  Phone   Notes  °[place] [facility] [address] °[place], [place] [address] (336) 641-8030 --Must be a resident of [place] °-- Must have NO insurance coverage whatsoever (no Medicaid/ Medicare, etc.) °-- The pt. MUST have a primary care doctor that directs their care regularly and follows them in the community °  °[facility]  (866) 331-1348   °[employer]  (888) 892-1162   ° °Agencies that provide inexpensive medical care: °Organization           Address  Phone   Notes  °[facility]  (336) 832-8035   °[facility]    (336) 832-7272   °[facility] [address] °[place], [place] [address] (336) 832-4777   °[facility] [address], °[place] (336) 271-4999   °[facility]    (336) 373-0678   °[facility]    (336) 272-1050   °[facility] ° [address], [place] Phone:  (336) 832-4444, Fax:  (336) 832-4440 Hours of Operation:  9 am - 6 pm, M-F.  Also accepts Medicaid/Medicare and self-pay.  °[facility] ° [address], [place] Phone: (336) 832-3150, Fax: (336)  832-3151. Hours of Operation:  8:30 am - 5:30 pm, M-F.  Also accepts Medicaid and self-pay.  °[facility] [place] [address], [place] Phone: (336) 878-6027   °[facility] [address], [place], [place] (336)723-1848, Ext. 123 Mondays & Thursdays: 7-9 AM.  First 15 patients are seen on a first come, first serve basis. °  ° °Medicaid-accepting [place] Providers: ° °Organization         Address  Phone   Notes  °[facility] [address], [place] (336) 641-2100 Also accepts self-pay patients.  °[facility] [address], [place] ° (336) 856-9996   °[facility] [address], [place] (336) 288-8857   °[facility] [address] [place] [address], [place] (336) 299-7000   °Veita Bland [address], [place]  ° (336) 373-1557 Only accepts [place] Access Medicaid patients after they have their name applied to their card.  ° °Self-Pay (no insurance) in [place]: ° °Organization         Address  Phone   Notes  °Sickle Cell Patients, [facility] [address], [place] (336) 832-1970   °[facility] [address], [place] (336) 832-4400   °[facility] ° [address], [place] (336) 992-4800   °[facility]/Dr. Osei-Bonsu ° [address] [place] [address], [place] or [address], [place] (336) 841-8500 Phone number for both [place] and [place] locations is the same.  °[facility] [address], [place] (336) 299-0000   °[facility] [address] [place] [address], [place] or [address] (336) 852-7530 °(336) 878-2260   °[facility] [address], [place] (336) 350-1642, phone; (336) 294-5005, fax Sees patients 1st and 3rd Saturday of every month.  Must not qualify for public or private insurance (i.e.  Medicaid, Medicare, [place] Health Choice, Veterans' Benefits) • Household income should be no more than 200% of the poverty level •The clinic cannot treat you if you are pregnant or think you are pregnant • Sexually transmitted diseases are not treated at the clinic.  ° ° °Dental Care: °Organization         Address  Phone  Notes  °[facility] [address],  (336) 641-6152 Accepts children up to age 21 who are enrolled in Medicaid or [place] Health Choice; pregnant women with a Medicaid card; and children who have applied for Medicaid or  Health Choice, but were declined, whose parents can pay a reduced fee at time   of service.  °[facility] [place]  [address], [place] (336) 641-7733 Accepts children up to age 21 who are enrolled in Medicaid or [place] Health Choice; pregnant women with a Medicaid card; and children who have applied for Medicaid or [place] Health Choice, but were declined, whose parents can pay a reduced fee at time of service.  °[facility] ° [address], [place] (336) 641-4533 Patients are seen by appointment only. Walk-ins are not accepted. [facility] will see patients 18 years of age and older. °Monday - Tuesday (8am-5pm) °Most Wednesdays (8:30-5pm) °$30 per visit, cash only  °[facility] ° [address], [place] (336) 641-4533 Patients are seen by appointment only. Walk-ins are not accepted. [facility] will see patients 18 years of age and older. °One Wednesday Evening (Monthly: Volunteer Based).  $30 per visit, cash only  °[facility]  (919) 537-3737 for adults; Children under age 4, call Graduate Pediatric Dentistry at (919) [phone]. Children aged 4-14, please call (919) [phone] to request a pediatric application. ° Dental services are provided in all areas of dental care including fillings,  crowns and bridges, complete and partial dentures, implants, gum treatment, root canals, and extractions. Preventive care is also provided. Treatment is provided to both adults and children. °Patients are selected via a lottery and there is often a waiting list. °  °[facility] [address], °[place] ° (336) 763-8833 [email or web address] °  °[facility] [address], [place], [place] (336)723-1848, Ext. 123 Second and Fourth Thursday of each month, opens at 6:30 AM; Clinic ends at 9 AM.  Patients are seen on a first-come first-served basis, and a limited number are seen during each clinic.  ° °[facility] ° [address], [place], [place] (336) 723-7904   Eligibility Requirements °You must have lived in [place], [place], or [place] counties for at least the last three months. °  You cannot be eligible for state or federal sponsored healthcare insurance, including Veterans Administration, Medicaid, or Medicare. °  You generally cannot be eligible for healthcare insurance through your employer.  °  How to apply: °Eligibility screenings are held every Tuesday and Wednesday afternoon from 1:00 pm until 4:00 pm. You do not need an appointment for the interview!  °[facility] [address], [place], [place] 336-631-2330   °[facility]  336-342-8273   °[facility]  336-703-3100   °[facility]  336-570-6415   ° °Behavioral Health Resources in the Community: °Intensive Outpatient Programs °Organization         Address  Phone  Notes  °[facility] [address], [place], [place] 336-878-6098   °[facility] [address], [place], [place] 336-832-9800   °[facility] [address], [place], [place] ° 336-882-2125   °[facility] [address],  °[place], [place] 1-800-853-5163 or 336-641-4981   °Substance Abuse  Resources °Organization         Address  Phone  Notes  °[facility]  336-882-2125   °[facility]  336-784-9470   °[facility]  336-285-9073   °[facility]  336-845-3988   °Residential & Outpatient Substance Abuse Program  1-800-659-3381   °Psychological Services °Organization         Address    Phone  Notes  °[facility]  336- 832-9600   °[facility]  336- 378-7881   °[facility] [address], [place] 1-800-853-5163 or 336-641-4981   ° °Mobile Crisis Teams °Organization         Address  Phone  Notes  °[facility], Mobile Crisis Care Unit  1-877-626-1772   °Assertive °[facility] ° [address] [place], [place] 336-834-9664   °Sharon DeEsch [address] °[place] 336-554-5454   ° °Self-Help/Support Groups °Organization         Address  Phone             Notes  °[facility] - variety of support groups  336- 373-1402 Call for more information  °Narcotics Anonymous (NA), [facility] [address], °[place]  2 meetings at this location  ° °Residential Treatment Programs °Organization         Address  Phone  Notes  °[facility] [address],    °[place]  1-866-801-8205   °[facility] ° [address], [place], [place] 704-293-8524   °[facility] [address], [place] 336-845-3988 Admissions: 8am-3pm M-F  °[facility] [address]    °[place], [place] 336-841-1104   °[facility] [address], [place], [place] 336-379-7146   °[facility] [address]  °[place], [place] 336-285-9073   °[facility] - Intensive Outpatient [address], [place], [place] 336-852-3033   °[facility] [address]  °[place], [place] 1-877-615-2722 or 336-784-9470   °[facility] [address] [place], [place] 336-227-7417 Accepts Medicaid  °[facility] [address]  ° [place] 1-800-659-3381 Substance Abuse/Addiction Treatment  ° °[place] Behavioral Health Resources °Organization         Address  Phone  Notes  °[facility]  (888) 581-9988   °Julie Brannon, PhD [address] [place], [place]   (336) 349-5553 or (336) 951-0000   °[facility]   [address] °[place], [place] (336) 349-4454   °[facility] [address], [place], [place] (336) 342-8316 Insurance/Medicaid/sponsorship through [facility]  °[facility] [address]                                    [place], [place] (336) 342-8316 Therapy/tele-psych/case  °[facility] [address]  ° [place], [place] (336) 349-2233    °Dr. Arfeen  (336) 349-4544   °[facility] [place]  [employer] [place] [facility] 1) [address], [place] °2) [address], [place] °3)  [address], [place] (336) 349-3220 °(336) 342-7768 ° °(336) 342-8140   °[place] Child Abuse [facility] (336) 342-1394 or (336) 342-3537 (After Hours)    ° ° ° °

## 2013-05-11 ENCOUNTER — Telehealth (HOSPITAL_BASED_OUTPATIENT_CLINIC_OR_DEPARTMENT_OTHER): Payer: Self-pay | Admitting: *Deleted

## 2013-05-11 NOTE — Telephone Encounter (Signed)
Pt called because the Zantac was causing nausea and diarrhea. Pt sts cannot come to hospital to be evaluated because she has a 89 yr old and husband is out of town. Pt requesting rx for reglan and prilosec

## 2013-05-11 NOTE — Telephone Encounter (Signed)
Per pt's request I asked MD Hyacinth MeekerMiller for a rx for Prilosec 20mg  PO q daily, #30 (No refills) and a rx for Reglan 10mg  PO TID PRN, #30 (No refills).  MD Hyacinth MeekerMiller wrote the rxs and [facility] at [phone].

## 2013-07-28 ENCOUNTER — Emergency Department (HOSPITAL_COMMUNITY): Payer: Medicaid Other

## 2013-07-28 ENCOUNTER — Encounter (HOSPITAL_COMMUNITY): Payer: Self-pay | Admitting: Emergency Medicine

## 2013-07-28 ENCOUNTER — Emergency Department (HOSPITAL_COMMUNITY)
Admission: EM | Admit: 2013-07-28 | Discharge: 2013-07-28 | Disposition: A | Discharge status: Home / Self Care | Payer: Medicaid Other | Attending: Emergency Medicine | Admitting: Emergency Medicine

## 2013-07-28 DIAGNOSIS — Z3202 Encounter for pregnancy test, result negative: Secondary | ICD-10-CM | POA: Insufficient documentation

## 2013-07-28 DIAGNOSIS — R059 Cough, unspecified: Secondary | ICD-10-CM

## 2013-07-28 DIAGNOSIS — R110 Nausea: Secondary | ICD-10-CM

## 2013-07-28 DIAGNOSIS — Z8774 Personal history of (corrected) congenital malformations of heart and circulatory system: Secondary | ICD-10-CM | POA: Insufficient documentation

## 2013-07-28 DIAGNOSIS — Z8673 Personal history of transient ischemic attack (TIA), and cerebral infarction without residual deficits: Secondary | ICD-10-CM | POA: Insufficient documentation

## 2013-07-28 DIAGNOSIS — R05 Cough: Secondary | ICD-10-CM

## 2013-07-28 DIAGNOSIS — R1013 Epigastric pain: Secondary | ICD-10-CM

## 2013-07-28 DIAGNOSIS — Z8719 Personal history of other diseases of the digestive system: Secondary | ICD-10-CM | POA: Insufficient documentation

## 2013-07-28 DIAGNOSIS — Z8632 Personal history of gestational diabetes: Secondary | ICD-10-CM | POA: Insufficient documentation

## 2013-07-28 LAB — CBC WITH DIFFERENTIAL/PLATELET
BASOS ABS: 0 10*3/uL (ref 0.0–0.1)
Basophils Relative: 0 % (ref 0–1)
EOS ABS: 0.1 10*3/uL (ref 0.0–0.7)
Eosinophils Relative: 1 % (ref 0–5)
HCT: 38.4 % (ref 36.0–46.0)
Hemoglobin: 12.8 g/dL (ref 12.0–15.0)
Lymphocytes Relative: 12 % (ref 12–46)
Lymphs Abs: 2.1 10*3/uL (ref 0.7–4.0)
MCH: 28.2 pg (ref 26.0–34.0)
MCHC: 33.3 g/dL (ref 30.0–36.0)
MCV: 84.6 fL (ref 78.0–100.0)
Monocytes Absolute: 1.4 10*3/uL — ABNORMAL HIGH (ref 0.1–1.0)
Monocytes Relative: 8 % (ref 3–12)
NEUTROS PCT: 79 % — AB (ref 43–77)
Neutro Abs: 13.9 10*3/uL — ABNORMAL HIGH (ref 1.7–7.7)
PLATELETS: 293 10*3/uL (ref 150–400)
RBC: 4.54 MIL/uL (ref 3.87–5.11)
RDW: 13.6 % (ref 11.5–15.5)
WBC: 17.5 10*3/uL — ABNORMAL HIGH (ref 4.0–10.5)

## 2013-07-28 LAB — URINALYSIS, ROUTINE W REFLEX MICROSCOPIC
Bilirubin Urine: NEGATIVE
Glucose, UA: NEGATIVE mg/dL
Hgb urine dipstick: NEGATIVE
KETONES UR: NEGATIVE mg/dL
Nitrite: NEGATIVE
PH: 7.5 (ref 5.0–8.0)
Protein, ur: NEGATIVE mg/dL
Specific Gravity, Urine: 1.016 (ref 1.005–1.030)
Urobilinogen, UA: 0.2 mg/dL (ref 0.0–1.0)

## 2013-07-28 LAB — COMPREHENSIVE METABOLIC PANEL
ALT: 19 U/L (ref 0–35)
AST: 18 U/L (ref 0–37)
Albumin: 3.6 g/dL (ref 3.5–5.2)
Alkaline Phosphatase: 70 U/L (ref 39–117)
BUN: 13 mg/dL (ref 6–23)
CO2: 26 mEq/L (ref 19–32)
Calcium: 9.3 mg/dL (ref 8.4–10.5)
Chloride: 103 mEq/L (ref 96–112)
Creatinine, Ser: 0.8 mg/dL (ref 0.50–1.10)
GFR calc non Af Amer: >90 mL/min (ref >90)
Glucose, Bld: 97 mg/dL (ref 70–99)
Potassium: 3.9 mEq/L (ref 3.7–5.3)
SODIUM: 141 meq/L (ref 137–147)
TOTAL PROTEIN: 7 g/dL (ref 6.0–8.3)
Total Bilirubin: <0.2 mg/dL — ABNORMAL LOW (ref 0.3–1.2)

## 2013-07-28 LAB — URINE MICROSCOPIC-ADD ON

## 2013-07-28 LAB — LIPASE, BLOOD: Lipase: 24 U/L (ref 11–59)

## 2013-07-28 LAB — I-STAT TROPONIN, ED: Troponin i, poc: 0.01 ng/mL (ref 0.00–0.08)

## 2013-07-28 LAB — TSH: TSH: 1.98 u[IU]/mL (ref 0.350–4.500)

## 2013-07-28 LAB — PREGNANCY, URINE: Preg Test, Ur: NEGATIVE

## 2013-07-28 MED ORDER — FAMOTIDINE 20 MG PO TABS
20.0000 mg | ORAL_TABLET | Freq: Once | ORAL | Status: AC
  Administered 2013-07-28: 20 mg via ORAL
  Filled 2013-07-28: qty 1

## 2013-07-28 MED ORDER — IOHEXOL 300 MG/ML  SOLN
80.0000 mL | Freq: Once | INTRAMUSCULAR | Status: AC | PRN
  Administered 2013-07-28: 80 mL via INTRAVENOUS

## 2013-07-28 MED ORDER — OMEPRAZOLE MAGNESIUM 20 MG PO TBEC: 20.0000 mg | DELAYED_RELEASE_TABLET | Freq: Every day | ORAL | Status: DC

## 2013-07-28 MED ORDER — MORPHINE SULFATE 4 MG/ML IJ SOLN
6.0000 mg | Freq: Once | INTRAMUSCULAR | Status: AC
  Administered 2013-07-28: 6 mg via INTRAVENOUS
  Filled 2013-07-28: qty 2

## 2013-07-28 MED ORDER — IOHEXOL 300 MG/ML  SOLN
25.0000 mL | INTRAMUSCULAR | Status: AC
  Administered 2013-07-28: 25 mL via ORAL

## 2013-07-28 MED ORDER — KETOROLAC TROMETHAMINE 30 MG/ML IJ SOLN
30.0000 mg | Freq: Once | INTRAMUSCULAR | Status: AC
  Administered 2013-07-28: 30 mg via INTRAVENOUS
  Filled 2013-07-28: qty 1

## 2013-07-28 MED ORDER — ONDANSETRON HCL 4 MG PO TABS: 4.0000 mg | ORAL_TABLET | Freq: Four times a day (QID) | ORAL | Status: DC

## 2013-07-28 MED ORDER — GI COCKTAIL ~~LOC~~
30.0000 mL | Freq: Once | ORAL | Status: AC
  Administered 2013-07-28: 30 mL via ORAL
  Filled 2013-07-28: qty 30

## 2013-07-28 NOTE — ED Notes (Signed)
Pt here by ems for upper abd pain, ambulatory with ems, pt reports hx of gerd and gall stones, and sts out of prilosec for several weeks. Pt reports nausea and bloating but no other symptoms.

## 2013-07-28 NOTE — ED Provider Notes (Signed)
CT scan results reviewed with patient.  No acute findings to explain her symptoms.  Will dc home with rx for antacids and I recommended pcp follow up  Linwood DibblesJon Knapp, MD 07/28/13 [phone]

## 2013-07-28 NOTE — ED Notes (Signed)
MD to bedside to evaluate pt's report of worsening pain. Reports will given pain medication after urine preg test is negative. Pt reminded to drink contrast, offered nausea meds, but refused. Pt tearful, sleeping infant at bedside with patient.

## 2013-07-28 NOTE — Discharge Instructions (Signed)

## 2013-07-28 NOTE — ED Notes (Signed)
Patient transported to X-ray 

## 2013-07-28 NOTE — ED Provider Notes (Signed)
CSN: [id number]     Arrival date & time 07/28/13  0355 History   First MD Initiated Contact with Patient 07/28/13 [phone]     Chief Complaint  Patient presents with  . Abdominal Pain     (Consider location/radiation/quality/duration/timing/severity/associated sxs/prior Treatment) HPI Comments: 33 year old female with history of reflux, gallstones, stroke presents with epigastric discomfort and cough. Patient has had a productive cough for the past 3 days, no fevers or chills and no sick contacts. Patient has had gradually worsening epigastric pain with nausea and bloating sensation for the past week and she's been out of her Prilosec for 2 weeks. This feels mildly similar to reflux however more severe. No alcohol or smoking use. No abdominal surgery history. At times pain is worse with eating however majority of the time it's random epigastric discomfort.  No ulcer history known.  Patient is a 33 y.o. female presenting with abdominal pain. The history is provided by the patient.  Abdominal Pain Associated symptoms: nausea   Associated symptoms: no chest pain, no chills, no dysuria, no fever, no shortness of breath and no vomiting     Past Medical History  Diagnosis Date  . Gestational diabetes   . ASD (atrial septal defect)   . Stroke   . GERD (gastroesophageal reflux disease)    Past Surgical History  Procedure Laterality Date  . Asd repair     History reviewed. No pertinent family history. History  Substance Use Topics  . Smoking status: Never Smoker   . Smokeless tobacco: Not on file  . Alcohol Use: No   OB History   Grav Para Term Preterm Abortions TAB SAB Ect Mult Living                 Review of Systems  Constitutional: Negative for fever and chills.  HENT: Negative for congestion.   Eyes: Negative for visual disturbance.  Respiratory: Negative for shortness of breath.   Cardiovascular: Negative for chest pain.  Gastrointestinal: Positive for nausea and abdominal  pain. Negative for vomiting and blood in stool.  Genitourinary: Positive for flank pain (left lower). Negative for dysuria.  Musculoskeletal: Negative for back pain, neck pain and neck stiffness.  Skin: Negative for rash.  Neurological: Negative for light-headedness.      Allergies  Peanut butter flavor  Home Medications   Prior to Admission medications   Not on File   BP 110/71  Pulse 58  Temp(Src) 97.7 F (36.5 C) (Oral)  Resp 15  Ht 5\' 3"  (1.6 m)  Wt 170 lb (77.111 kg)  BMI 30.12 kg/m2  SpO2 97%  LMP 07/07/2013 Physical Exam  Nursing note and vitals reviewed. Constitutional: She is oriented to person, place, and time. She appears well-developed and well-nourished.  HENT:  Head: Normocephalic and atraumatic.  Mild dry mucous membranes  Eyes: Conjunctivae are normal. Right eye exhibits no discharge. Left eye exhibits no discharge.  Neck: Normal range of motion. Neck supple. No tracheal deviation present.  Cardiovascular: Normal rate and regular rhythm.   Pulmonary/Chest: Effort normal and breath sounds normal.  Abdominal: Soft. She exhibits no distension. There is tenderness. There is no guarding (mild epigastric).  Musculoskeletal: She exhibits no edema.  Neurological: She is alert and oriented to person, place, and time.  Skin: Skin is warm. No rash noted.  Psychiatric: She has a normal mood and affect.    ED Course  Procedures (including critical care time) Emergency Focused Ultrasound Exam Limited retroperitoneal ultrasound of kidneys  Performed and  interpreted by Dr. Jodi MourningZavitz Indication: flank pain Focused abdominal ultrasound with both kidneys imaged in transverse and longitudinal planes in real-time. Interpretation: No hydronephrosis visualized.   Images archived electronically  Labs Review Labs Reviewed  CBC WITH DIFFERENTIAL - Abnormal; Notable for the following:    WBC 17.5 (*)    Neutrophils Relative % 79 (*)    Neutro Abs 13.9 (*)    Monocytes  Absolute 1.4 (*)    All other components within normal limits  COMPREHENSIVE METABOLIC PANEL - Abnormal; Notable for the following:    Total Bilirubin <0.2 (*)    All other components within normal limits  URINALYSIS, ROUTINE W REFLEX MICROSCOPIC - Abnormal; Notable for the following:    APPearance CLOUDY (*)    Leukocytes, UA TRACE (*)    All other components within normal limits  URINE MICROSCOPIC-ADD ON - Abnormal; Notable for the following:    Squamous Epithelial / LPF FEW (*)    All other components within normal limits  LIPASE, BLOOD  TSH  PREGNANCY, URINE  I-STAT TROPOININ, ED  POC URINE PREG, ED    Imaging Review Dg Chest 2 View  07/28/2013   CLINICAL DATA:  Cough. Low back pain. Left epigastric pain for 3 days.  EXAM: CHEST  2 VIEW  COMPARISON:  03/10/2013  FINDINGS: Shallow inspiration. The heart size and mediastinal contours are within normal limits. Both lungs are clear. The visualized skeletal structures are unremarkable.  IMPRESSION: No active cardiopulmonary disease.   Electronically Signed   By: Burman NievesWilliam  Stevens M.D.   On: 07/28/2013 05:05     EKG Interpretation None      MDM   Final diagnoses:  Epigastric pain  Nausea  Cough   Patient with mild cough and upper Estrace symptoms with clear lungs and chest x-ray unremarkable. Likely bronchitis. Abdominal pain worse than her normal and minimal improvement with GI cocktail, white blood cell count came back 17,000 and worsening pain in the ER. CT scan ordered for further delineation of her pain. With left flank pain bedside ultrasound done no significant hydronephrosis visualized. Medications  iohexol (OMNIPAQUE) 300 MG/ML solution 25 mL (25 mLs Oral Contrast Given 07/28/13 0649)  ketorolac (TORADOL) 30 MG/ML injection 30 mg (not administered)  gi cocktail (Maalox,Lidocaine,Donnatal) (30 mLs Oral Given 07/28/13 0426)  famotidine (PEPCID) tablet 20 mg (20 mg Oral Given 07/28/13 0513)  morphine 4 MG/ML injection 6 mg  (6 mg Intravenous Given 07/28/13 0645)   Filed Vitals:   07/28/13 0521 07/28/13 0657 07/28/13 0700 07/28/13 0715  BP: 103/60 136/118 111/61 110/71  Pulse: 87 75 67 58  Temp:      TempSrc:      Resp: 26 19 18 15   Height:      Weight:      SpO2: 98% 98% 98% 97%    On recheck no pain worsening epigastric, CT scan pending. Repeat pain medicines. Patient's care will be signed out to followup CT scan and reassess prior to disposition.     Enid SkeensJoshua M Zavitz, MD 07/28/13 [phone]

## 2013-07-28 NOTE — ED Notes (Signed)
Patient transported to CT 

## 2013-07-28 NOTE — ED Notes (Addendum)
PT in a lot of pain talked to Dr Salvadore FarberJZ

## 2013-07-29 LAB — URINE CULTURE

## 2014-08-18 ENCOUNTER — Emergency Department (HOSPITAL_COMMUNITY)
Admission: EM | Admit: 2014-08-18 | Discharge: 2014-08-18 | Disposition: A | Discharge status: Home / Self Care | Payer: Medicaid Other | Attending: Emergency Medicine | Admitting: Emergency Medicine

## 2014-08-18 ENCOUNTER — Encounter (HOSPITAL_COMMUNITY): Payer: Self-pay | Admitting: *Deleted

## 2014-08-18 ENCOUNTER — Emergency Department (HOSPITAL_COMMUNITY): Payer: Medicaid Other

## 2014-08-18 DIAGNOSIS — R0789 Other chest pain: Secondary | ICD-10-CM

## 2014-08-18 DIAGNOSIS — Z7982 Long term (current) use of aspirin: Secondary | ICD-10-CM | POA: Insufficient documentation

## 2014-08-18 DIAGNOSIS — R079 Chest pain, unspecified: Secondary | ICD-10-CM | POA: Diagnosis present

## 2014-08-18 DIAGNOSIS — Z8673 Personal history of transient ischemic attack (TIA), and cerebral infarction without residual deficits: Secondary | ICD-10-CM | POA: Insufficient documentation

## 2014-08-18 DIAGNOSIS — K219 Gastro-esophageal reflux disease without esophagitis: Secondary | ICD-10-CM | POA: Diagnosis not present

## 2014-08-18 LAB — CBC
HCT: 37 % (ref 36.0–46.0)
Hemoglobin: 12.5 g/dL (ref 12.0–15.0)
MCH: 28.4 pg (ref 26.0–34.0)
MCHC: 33.8 g/dL (ref 30.0–36.0)
MCV: 84.1 fL (ref 78.0–100.0)
Platelets: 288 10*3/uL (ref 150–400)
RBC: 4.4 MIL/uL (ref 3.87–5.11)
RDW: 13.4 % (ref 11.5–15.5)
WBC: 7 10*3/uL (ref 4.0–10.5)

## 2014-08-18 LAB — TROPONIN I: Troponin I: <0.03 ng/mL (ref <0.031)

## 2014-08-18 LAB — BASIC METABOLIC PANEL
Anion gap: 6 (ref 5–15)
BUN: 7 mg/dL (ref 6–20)
CO2: 27 mmol/L (ref 22–32)
Calcium: 9.3 mg/dL (ref 8.9–10.3)
Chloride: 104 mmol/L (ref 101–111)
Creatinine, Ser: 0.83 mg/dL (ref 0.44–1.00)
GFR calc Af Amer: >60 mL/min (ref >60)
GFR calc non Af Amer: >60 mL/min (ref >60)
Glucose, Bld: 117 mg/dL — ABNORMAL HIGH (ref 65–99)
Potassium: 4.1 mmol/L (ref 3.5–5.1)
Sodium: 137 mmol/L (ref 135–145)

## 2014-08-18 MED ORDER — MORPHINE SULFATE 4 MG/ML IJ SOLN
4.0000 mg | Freq: Once | INTRAMUSCULAR | Status: AC
  Administered 2014-08-18: 4 mg via INTRAVENOUS
  Filled 2014-08-18: qty 1

## 2014-08-18 MED ORDER — SODIUM CHLORIDE 0.9 % IV BOLUS (SEPSIS)
1000.0000 mL | Freq: Once | INTRAVENOUS | Status: AC
  Administered 2014-08-18: 1000 mL via INTRAVENOUS

## 2014-08-18 MED ORDER — ONDANSETRON HCL 4 MG/2ML IJ SOLN
4.0000 mg | Freq: Once | INTRAMUSCULAR | Status: DC
  Filled 2014-08-18: qty 2

## 2014-08-18 MED ORDER — HYDROCODONE-ACETAMINOPHEN 5-325 MG PO TABS: 1.0000 | ORAL_TABLET | Freq: Four times a day (QID) | ORAL | Status: DC | PRN

## 2014-08-18 MED ORDER — HYDROMORPHONE HCL 1 MG/ML IJ SOLN
1.0000 mg | Freq: Once | INTRAMUSCULAR | Status: AC
  Administered 2014-08-18: 1 mg via INTRAVENOUS
  Filled 2014-08-18: qty 1

## 2014-08-18 NOTE — ED Provider Notes (Signed)
CSN: [id number]     Arrival date & time 08/18/14  1004 History   First MD Initiated Contact with Patient 08/18/14 1117     Chief Complaint  Patient presents with  . Chest Pain     (Consider location/radiation/quality/duration/timing/severity/associated sxs/prior Treatment) HPI Desiree Martin is a 34 year old female past medical history of ASD, CVA, GERD who presents the ER complaining of chest discomfort. Patient reports her 9:00 last night she had a gradual onset of central chest pain which was stabbing in nature. Patient states the pain has persisted until today. Patient says that tonight the pain gradually became worse and would decrease slightly. Patient reports alleviating factors of landing on her right side. Patient reports aggravating factors of leaning forward and lying flat on her back. Patient reports some mild associated nausea last night. She also reports associated shortness of breath, which she describes as "it is hard for me to take a deep breath". Patient denies associated headache, blurred vision, dizziness, weakness, vomiting, abdominal pain.  Past Medical History  Diagnosis Date  . ASD (atrial septal defect)   . Stroke   . GERD (gastroesophageal reflux disease)    Past Surgical History  Procedure Laterality Date  . Asd repair     No family history on file. History  Substance Use Topics  . Smoking status: Never Smoker   . Smokeless tobacco: Not on file  . Alcohol Use: No   OB History    No data available     Review of Systems  Constitutional: Negative for fever.  HENT: Negative for trouble swallowing.   Eyes: Negative for visual disturbance.  Respiratory: Negative for shortness of breath.   Cardiovascular: Positive for chest pain.  Gastrointestinal: Negative for nausea, vomiting and abdominal pain.  Genitourinary: Negative for dysuria.  Musculoskeletal: Negative for neck pain.  Skin: Negative for rash.  Neurological: Negative for dizziness, weakness and  numbness.  Psychiatric/Behavioral: Negative.       Allergies  Peanut butter flavor  Home Medications   Prior to Admission medications   Medication Sig Start Date End Date Taking? Authorizing Provider  aspirin EC 81 MG tablet Take 81 mg by mouth daily.   Yes Historical Provider, MD  HYDROcodone-acetaminophen (NORCO/VICODIN) 5-325 MG per tablet Take 1-2 tablets by mouth every 6 (six) hours as needed. 08/18/14   Ladona Mow, PA-C  omeprazole (PRILOSEC OTC) 20 MG tablet Take 1 tablet (20 mg total) by mouth daily. Patient not taking: Reported on 08/18/2014 07/28/13   Linwood Dibbles, MD  ondansetron (ZOFRAN) 4 MG tablet Take 1 tablet (4 mg total) by mouth every 6 (six) hours. Patient not taking: Reported on 08/18/2014 07/28/13   Linwood Dibbles, MD   BP 104/50 mmHg  Pulse 67  Temp(Src) 97.9 F (36.6 C) (Oral)  Resp 20  SpO2 90%  LMP 08/13/2014 Physical Exam  Constitutional: She is oriented to person, place, and time. She appears well-developed and well-nourished. No distress.  HENT:  Head: Normocephalic and atraumatic.  Mouth/Throat: Oropharynx is clear and moist. No oropharyngeal exudate.  Eyes: Right eye exhibits no discharge. Left eye exhibits no discharge. No scleral icterus.  Neck: Normal range of motion.  Cardiovascular: Normal rate, regular rhythm and normal heart sounds.   No murmur heard. Pulmonary/Chest: Effort normal and breath sounds normal. No respiratory distress.  Abdominal: Soft. There is no tenderness.  Musculoskeletal: Normal range of motion. She exhibits no edema or tenderness.  Neurological: She is alert and oriented to person, place, and time. No cranial nerve  deficit. Coordination normal.  Skin: Skin is warm and dry. No rash noted. She is not diaphoretic.  Psychiatric: She has a normal mood and affect.  Nursing note and vitals reviewed.   ED Course  Procedures (including critical care time) Labs Review Labs Reviewed  BASIC METABOLIC PANEL - Abnormal; Notable for the  following:    Glucose, Bld 117 (*)    All other components within normal limits  CBC  TROPONIN I  TROPONIN I    Imaging Review Dg Chest 2 View  08/18/2014   CLINICAL DATA:  Chest pain. Chest and LEFT arm pain. Dizziness and nausea. History of stroke 10 years ago. Atrial septal defect repair 6 years ago.  EXAM: CHEST  2 VIEW  COMPARISON:  07/28/2013.  FINDINGS: Cardiopericardial silhouette within normal limits. Mediastinal contours normal. Trachea midline. No airspace disease or effusion. Radiopaque density is present over the cardiopericardial silhouette compatible with atrial septal defect repair.  IMPRESSION: No active cardiopulmonary disease.   Electronically Signed   By: Andreas NewportGeoffrey  Lamke M.D.   On: 08/18/2014 11:27     EKG Interpretation   Date/Time:  Tuesday August 18 2014 13:59:27 EDT Ventricular Rate:  42 PR Interval:  139 QRS Duration: 96 QT Interval:  517 QTC Calculation: 432 R Axis:   67 Text Interpretation:  Sinus bradycardia no significant change aside from  rate Confirmed by Juleen ChinaKOHUT  MD, STEPHEN ([id number]) on 08/18/2014 2:28:01 PM      MDM   Final diagnoses:  Atypical chest pain   Chest pain is atypical, and not likely of cardiac or pulmonary etiology d/t presentation, perc negative, VSS, no tracheal deviation, no JVD or new murmur, RRR, breath sounds equal bilaterally, EKG without acute abnormalities, negative troponin, with negative delta troponin, and negative CXR. Pt has been advised return to the ED if CP becomes exertional, associated with diaphoresis or nausea, radiates to left jaw/arm, worsens or becomes concerning in any way. Pt appears reliable for follow up and is agreeable to discharge.   BP 104/50 mmHg  Pulse 67  Temp(Src) 97.9 F (36.6 C) (Oral)  Resp 20  SpO2 90%  LMP 08/13/2014  Signed,  Ladona MowJoe Herta Hink, PA-C 4:18 PM  Patient seen and discussed with Dr. Raeford RazorStephen Kohut, MD      Ladona MowJoe Kerim Statzer, PA-C 08/18/14 [id number]  Raeford RazorStephen Kohut, MD 08/21/14 1434

## 2014-08-18 NOTE — ED Notes (Signed)
NAD at this time. Pt is stable and going home.  

## 2014-08-18 NOTE — Discharge Instructions (Signed)
Chest Pain (Nonspecific) °It is often hard to give a specific diagnosis for the cause of chest pain. There is always a chance that your pain could be related to something serious, such as a heart attack or a blood clot in the lungs. You need to follow up with your health care provider for further evaluation. °CAUSES  °· Heartburn. °· Pneumonia or bronchitis. °· Anxiety or stress. °· Inflammation around your heart (pericarditis) or lung (pleuritis or pleurisy). °· A blood clot in the lung. °· A collapsed lung (pneumothorax). It can develop suddenly on its own (spontaneous pneumothorax) or from trauma to the chest. °· Shingles infection (herpes zoster virus). °The chest wall is composed of bones, muscles, and cartilage. Any of these can be the source of the pain. °· The bones can be bruised by injury. °· The muscles or cartilage can be strained by coughing or overwork. °· The cartilage can be affected by inflammation and become sore (costochondritis). °DIAGNOSIS  °Lab tests or other studies may be needed to find the cause of your pain. Your health care provider may have you take a test called an ambulatory electrocardiogram (ECG). An ECG records your heartbeat patterns over a 24-hour period. You may also have other tests, such as: °· Transthoracic echocardiogram (TTE). During echocardiography, sound waves are used to evaluate how blood flows through your heart. °· Transesophageal echocardiogram (TEE). °· Cardiac monitoring. This allows your health care provider to monitor your heart rate and rhythm in real time. °· Holter monitor. This is a portable device that records your heartbeat and can help diagnose heart arrhythmias. It allows your health care provider to track your heart activity for several days, if needed. °· Stress tests by exercise or by giving medicine that makes the heart beat faster. °TREATMENT  °· Treatment depends on what may be causing your chest pain. Treatment may include: °¨ Acid blockers for  heartburn. °¨ Anti-inflammatory medicine. °¨ Pain medicine for inflammatory conditions. °¨ Antibiotics if an infection is present. °· You may be advised to change lifestyle habits. This includes stopping smoking and avoiding alcohol, caffeine, and chocolate. °· You may be advised to keep your head raised (elevated) when sleeping. This reduces the chance of acid going backward from your stomach into your esophagus. °Most of the time, nonspecific chest pain will improve within 2-3 days with rest and mild pain medicine.  °HOME CARE INSTRUCTIONS  °· If antibiotics were prescribed, take them as directed. Finish them even if you start to feel better. °· For the next few days, avoid physical activities that bring on chest pain. Continue physical activities as directed. °· Do not use any tobacco products, including cigarettes, chewing tobacco, or electronic cigarettes. °· Avoid drinking alcohol. °· Only take medicine as directed by your health care provider. °· Follow your health care provider's suggestions for further testing if your chest pain does not go away. °· Keep any follow-up appointments you made. If you do not go to an appointment, you could develop lasting (chronic) problems with pain. If there is any problem keeping an appointment, call to reschedule. °SEEK MEDICAL CARE IF:  °· Your chest pain does not go away, even after treatment. °· You have a rash with blisters on your chest. °· You have a fever. °SEEK IMMEDIATE MEDICAL CARE IF:  °· You have increased chest pain or pain that spreads to your arm, neck, jaw, back, or abdomen. °· You have shortness of breath. °· You have an increasing cough, or you cough   up blood. °· You have severe back or abdominal pain. °· You feel nauseous or vomit. °· You have severe weakness. °· You faint. °· You have chills. °This is an emergency. Do not wait to see if the pain will go away. Get medical help at once. Call your local emergency services (911 in [place] Do not drive  yourself to the hospital. °MAKE SURE YOU:  °· Understand these instructions. °· Will watch your condition. °· Will get help right away if you are not doing well or get worse. °Document Released: 11/02/2004 Document Revised: 01/28/2013 Document Reviewed: 08/29/2007 °ExitCare® Patient Information ©2015 [employer]. This information is not intended to replace advice given to you by your health care provider. Make sure you discuss any questions you have with your health care provider. ° ° °Emergency Department Resource Guide °1) Find a Doctor and Pay Out of Pocket °Although you won't have to find out who is covered by your insurance plan, it is a good idea to ask around and get recommendations. You will then need to call the office and see if the doctor you have chosen will accept you as a new patient and what types of options they offer for patients who are self-pay. Some doctors offer discounts or will set up payment plans for their patients who do not have insurance, but you will need to ask so you aren't surprised when you get to your appointment. ° °2) Contact Your Local Health Department °Not all health departments have doctors that can see patients for sick visits, but many do, so it is worth a call to see if yours does. If you don't know where your local health department is, you can check in your phone book. The CDC also has a tool to help you locate your state's health department, and many state websites also have listings of all of their local health departments. ° °3) Find a Walk-in Clinic °If your illness is not likely to be very severe or complicated, you may want to try a walk in clinic. These are popping up all over the country in pharmacies, drugstores, and shopping centers. They're usually staffed by nurse practitioners or physician assistants that have been trained to treat common illnesses and complaints. They're usually fairly quick and inexpensive. However, if you have serious medical issues or  chronic medical problems, these are probably not your best option. ° °No Primary Care Doctor: °- Call [facility] at  832-8000 - they can help you locate a primary care doctor that  accepts your insurance, provides certain services, etc. °- Physician Referral Service- 1-800-533-3463 ° °Chronic Pain Problems: °Organization         Address  Phone   Notes  °[facility]  (336) 297-2271 Patients need to be referred by their primary care doctor.  ° °Medication Assistance: °Organization         Address  Phone   Notes  °[place] [facility] [address] °[place], [place] [address] (336) 641-8030 --Must be a resident of [place] °-- Must have NO insurance coverage whatsoever (no Medicaid/ Medicare, etc.) °-- The pt. MUST have a primary care doctor that directs their care regularly and follows them in the community °  °[facility]  (866) 331-1348   °[employer]  (888) 892-1162   ° °Agencies that provide inexpensive medical care: °Organization         Address  Phone   Notes  °[facility]  (  336) 832-8035   °[facility]    (336) 832-7272   °[facility] [address] °[place], [place] [address] (336) 832-4777   °[facility] [address], °[place] (336) 271-4999   °[facility]    (336) 373-0678   °[facility]    (336) 272-1050   °[facility] ° [address], [place] Phone:  (336) 832-4444, Fax:  (336) 832-4440 Hours of Operation:  9 am - 6 pm, M-F.  Also accepts Medicaid/Medicare and self-pay.  °[facility] ° [address], [place] Phone: (336) [phone], Fax: (336) [phone]. Hours of Operation:  8:30 am - 5:30 pm, M-F.  Also accepts Medicaid and self-pay.  °[facility] [place] [address], [place] Phone: (336) 878-6027   °[facility] [address], [place], [place]  (336)723-1848, Ext. 123 Mondays & Thursdays: 7-9 AM.  First 15 patients are seen on a first come, first serve basis. °  ° °Medicaid-accepting [place] Providers: ° °Organization         Address  Phone   Notes  °[facility] [address], [place] (336) 641-2100 Also accepts self-pay patients.  °[facility] [address], [place] ° (336) 856-9996   °[facility] [address], [place] (336) 288-8857   °[facility] [address] [place] [address], [place] (336) 299-7000   °Veita Bland [address], [place]  ° (336) 373-1557 Only accepts [place] Access Medicaid patients after they have their name applied to their card.  ° °Self-Pay (no insurance) in [place]: ° °Organization         Address  Phone   Notes  °Sickle Cell Patients, [facility] [address], [place] (336) 832-1970   °[facility] [address], [place] (336) 832-4400   ° [facility] ° [address], [place] (336) 992-4800   °[facility]/Dr. Osei-Bonsu ° [address] [place] [address], [place] or [address], [place] (336) [phone] Phone number for both [place] and [place] locations is the same.  °[facility] [address], [place] (336) 299-0000   °[facility] [address] [place] [address], [place] or [address] (336) 852-7530 °(336) 878-2260   °[facility] [address], [place] (336) 350-1642, phone; (336) 294-5005, fax Sees patients 1st and 3rd Saturday of every month.  Must not qualify for public or private insurance (i.e. Medicaid, Medicare, [place] Health Choice, Veterans' Benefits) • Household income should be no more than 200% of the poverty level •The clinic cannot treat you if you are pregnant or think you are pregnant • Sexually transmitted  diseases are not treated at the clinic.  ° ° °Dental Care: °Organization         Address  Phone  Notes  °[facility] [address],  (336) 641-6152 Accepts children up to age 21 who are enrolled in Medicaid or [place] Health Choice; pregnant women with a Medicaid card; and children who have applied for Medicaid or [place] Health Choice, but were declined, whose parents can pay a reduced fee at time of service.  °[facility] [place]    [address], [place] (336) 641-7733 Accepts children up to age 21 who are enrolled in Medicaid or [place] Health Choice; pregnant women with a Medicaid card; and children who have applied for Medicaid or [place] Health Choice, but were declined, whose parents can pay a reduced fee at time of service.  °[facility] ° [address], [place] (336) 641-4533 Patients are seen by appointment only. Walk-ins are not accepted. [facility] will see patients 18 years of age and older. °Monday - Tuesday (8am-5pm) °Most Wednesdays (8:30-5pm) °$30 per visit, cash only  °[facility] ° [address], [place] (336) 641-4533 Patients are seen by appointment only. Walk-ins are not accepted. [facility] will see patients 18 years of age and older. °One Wednesday Evening (Monthly: Volunteer Based).  $30 per visit, cash only  °[facility]  (919) 537-3737 for adults; Children under age 4, call Graduate Pediatric Dentistry at ([phone]) [phone]. Children aged 4-14, please call ([phone]) [phone] to request a pediatric application. ° Dental services are provided in all areas of dental care including fillings, crowns and bridges, complete and partial dentures, implants, gum treatment, root canals, and extractions. Preventive care is also provided. Treatment is provided to both adults and children. °Patients are selected via a  lottery and there is often a waiting list. °  °[facility] [address], °[place] ° (336) 763-8833 [email or web address] °  °[facility] [address], [place], [place] (336)723-1848, Ext. 123 Second and Fourth Thursday of each month, opens at 6:30 AM; Clinic ends at 9 AM.  Patients are seen on a first-come first-served basis, and a limited number are seen during each clinic.  ° °[facility] ° [address], [place], [place] (336) 723-7904   Eligibility Requirements °You must have lived in [place], or [place] counties for at least the last three months. °  You cannot be eligible for state or federal sponsored healthcare insurance, including Veterans Administration, Medicaid, or Medicare. °  You generally cannot be eligible for healthcare insurance through your employer.  °  How to apply: °Eligibility screenings are held every Tuesday and Wednesday afternoon from 1:00 pm until 4:00 pm. You do not need an appointment for the interview!  °[facility] [address], [place], [place] 336-631-2330   °[facility]  336-342-8273   °[facility]  336-703-3100   °[facility]  336-570-6415   ° °Behavioral Health Resources in the Community: °Intensive Outpatient Programs °Organization         Address  Phone  Notes  °[facility] [address], [place], [place] 336-878-6098   °[facility] [address], [place], [place] 336-832-9800   °[facility] [address], [place], [place] ° 336-882-2125   °[facility] [address],  °[place], [place] 1-800-853-5163 or 336-641-4981   °Substance Abuse Resources °Organization         Address  Phone  Notes  °[facility]  336-882-2125   °[facility]  336-784-9470   °[facility]  336-285-9073   °[facility]  336-845-3988   °Residential &  Outpatient Substance Abuse Program  1-800-659-3381   °Psychological Services °Organization         Address  Phone  Notes  ° Health  336- 832-9600   °  [facility]  336- 378-7881   °[facility] [address], [place] 1-800-853-5163 or 336-641-4981   ° °Mobile Crisis Teams °Organization         Address  Phone  Notes  °[facility], Mobile Crisis Care Unit  1-877-626-1772   °Assertive °[facility] ° [address] [place], [place] 336-834-9664   °Sharon DeEsch [address] °[place] 336-554-5454   ° °Self-Help/Support Groups °Organization         Address  Phone             Notes  °[facility] - variety of support groups  336- 373-1402 Call for more information  °Narcotics Anonymous (NA), [facility] [address], °[place]  2 meetings at this location  ° °Residential Treatment Programs °Organization         Address  Phone  Notes  °[facility] [address],    °[place]  1-866-801-8205   °[facility] ° [address], [place], [place] 704-293-8524   °[facility] [address], [place] 336-845-3988 Admissions: 8am-3pm M-F  °[facility] [address]    °[place], [place] 336-841-1104   °[facility] [address], [place], [place] 336-379-7146   °[facility] [address]  °[place], [place] 336-285-9073   °[facility] - Intensive Outpatient [address], [place], [place] 336-852-3033   °[facility] [address]  °[place], [place] 1-877-615-2722 or 336-784-9470   °[facility] [address] [place], [place] 336-227-7417 Accepts Medicaid  °[facility] [address]  °[place] 1-800-659-3381 Substance Abuse/Addiction Treatment  ° °[place] Behavioral Health Resources °Organization          Address  Phone  Notes  °[facility]  (888) 581-9988   °Julie Brannon, PhD [address] [place], [place]   (336) 349-5553 or (336) 951-0000   °[facility]   [address] °[place], [place] (336) 349-4454   °[facility] [address], [place], [place] (336) 342-8316 Insurance/Medicaid/sponsorship through [facility]  °[facility] [address]                                    [place], [place] (336) 342-8316 Therapy/tele-psych/case  °[facility] [address]  ° [place],  (336) 349-2233    °Dr. Arfeen  (336) 349-4544   °[facility] [place]  [employer] [place] [facility] 1) [address], [place] °2) [address], [place] °3)  [address], [place] (336) 349-3220 °(336) 342-7768 ° °(336) 342-8140   °[place] [facility] (336) 342-1394 or (336) 342-3537 (After Hours)    ° ° ° °

## 2014-08-18 NOTE — ED Notes (Signed)
Last night substernal chest pain. Hurts with respirations and laying down.  CP lasted Throughout the night leading into morning. Called EMS b/c there was no relief.   of ASA given in route. Pt took  of alka seltzer at home before calling EMS without any relief. VS are as follows: HR: 50 BP: 119/78 O2sat: 98% on 2L CBG: 98 Resp: 16

## 2015-02-04 ENCOUNTER — Ambulatory Visit (HOSPITAL_BASED_OUTPATIENT_CLINIC_OR_DEPARTMENT_OTHER): Payer: PRIVATE HEALTH INSURANCE | Admitting: Neurology

## 2015-02-04 VITALS — BP 118/69 | HR 59 | Temp 98.9°F | Wt 202.0 lb

## 2015-02-04 DIAGNOSIS — G90522 Complex regional pain syndrome I of left lower limb: Secondary | ICD-10-CM

## 2015-02-04 DIAGNOSIS — G90512 Complex regional pain syndrome I of left upper limb: Secondary | ICD-10-CM

## 2015-02-04 MED ORDER — AMITRIPTYLINE HCL 25 MG PO TABS
25.0000 mg | ORAL_TABLET | Freq: Every evening | ORAL | 5 refills | Status: DC
Start: 2015-02-04 — End: 2015-04-16

## 2015-02-04 NOTE — Patient Instructions (Addendum)
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
## 2015-04-16 ENCOUNTER — Encounter (HOSPITAL_BASED_OUTPATIENT_CLINIC_OR_DEPARTMENT_OTHER): Payer: Self-pay

## 2015-04-16 ENCOUNTER — Emergency Department (HOSPITAL_BASED_OUTPATIENT_CLINIC_OR_DEPARTMENT_OTHER)
Admission: RE | Admit: 2015-04-16 | Disposition: A | Discharge status: Home / Self Care | Payer: Self-pay | Source: Emergency Department | Attending: Emergency Medicine | Admitting: Emergency Medicine

## 2015-04-16 LAB — XR FOREARM LEFT 2 VIEWS

## 2015-04-16 NOTE — Narrator Note (Signed)
Pt is back from x-ray

## 2015-04-16 NOTE — Discharge Instructions (Signed)
The exact cause of your pain is not known at this time  Follow up with your primary care provider as soon as possible  Return to the ER for any worsening symptoms or any further concerns.      Musculoskeletal Pain  Musculoskeletal pain is muscle and boney aches and pains. These pains can occur in any part of the body. Your caregiver may treat you without knowing the cause of the pain. They may treat you if blood or urine tests, X-rays, and other tests were normal.   CAUSES  There is often not a definite cause or reason for these pains. These pains may be caused by a type of germ (virus). The discomfort may also come from overuse. Overuse includes working out too hard when your body is not fit. Boney aches also come from weather changes. Bone is sensitive to atmospheric pressure changes.  HOME CARE INSTRUCTIONS    Ask when your test results will be ready. Make sure you get your test results.   Only take over-the-counter or prescription medicines for pain, discomfort, or fever as directed by your caregiver. If you were given medications for your condition, do not drive, operate machinery or power tools, or sign legal documents for 24 hours. Do not drink alcohol. Do not take sleeping pills or other medications that may interfere with treatment.   Continue all activities unless the activities cause more pain. When the pain lessens, slowly resume normal activities. Gradually increase the intensity and duration of the activities or exercise.   During periods of severe pain, bed rest may be helpful. Lay or sit in any position that is comfortable.   Putting ice on the injured area.    Put ice in a bag.    Place a towel between your skin and the bag.    Leave the ice on for 15 to 20 minutes, 3 to 4 times a day.   Follow up with your caregiver for continued problems and no reason can be found for the pain. If the pain becomes worse or does not go away, it may be necessary to repeat tests or do additional testing. Your  caregiver may need to look further for a possible cause.  SEEK IMMEDIATE MEDICAL CARE IF:   You have pain that is getting worse and is not relieved by medications.   You develop chest pain that is associated with shortness or breath, sweating, feeling sick to your stomach (nauseous), or throw up (vomit).   Your pain becomes localized to the abdomen.   You develop any new symptoms that seem different or that concern you.  MAKE SURE YOU:    Understand these instructions.   Will watch your condition.   Will get help right away if you are not doing well or get worse.     This information is not intended to replace advice given to you by your health care provider. Make sure you discuss any questions you have with your health care provider.     Document Released: 01/23/2005 Document Revised: 04/17/2011 Document Reviewed: 09/27/2012  Elsevier Interactive Patient Education [employer]2016 [employer]
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
## 2015-04-16 NOTE — Narrator Note (Signed)
Patient Disposition    Patient education for diagnosis, medications, activity, diet and follow-up.  Patient left ED 8:38 AM.  Patient rep received written instructions.  Interpreter to provide instructions: No    Patient belongings with patient: YES    Have all existing LDAs been addressed? N/A    Have all IV infusions been stopped? N/A    Discharged to: Discharged to home
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
## 2016-07-28 ENCOUNTER — Encounter (HOSPITAL_COMMUNITY): Payer: Self-pay | Admitting: Emergency Medicine

## 2016-07-28 ENCOUNTER — Emergency Department (HOSPITAL_COMMUNITY): Payer: Medicaid Other

## 2016-07-28 ENCOUNTER — Emergency Department (HOSPITAL_COMMUNITY)
Admission: EM | Admit: 2016-07-28 | Discharge: 2016-07-28 | Disposition: A | Discharge status: Home / Self Care | Payer: Medicaid Other | Source: Home / Self Care | Attending: Emergency Medicine | Admitting: Emergency Medicine

## 2016-07-28 ENCOUNTER — Encounter (HOSPITAL_COMMUNITY): Payer: Self-pay | Admitting: Nurse Practitioner

## 2016-07-28 ENCOUNTER — Observation Stay (HOSPITAL_COMMUNITY)
Admission: EM | Admit: 2016-07-28 | Discharge: 2016-07-29 | Disposition: A | Discharge status: Home / Self Care | Payer: Medicaid Other | Attending: Internal Medicine | Admitting: Internal Medicine

## 2016-07-28 DIAGNOSIS — R001 Bradycardia, unspecified: Secondary | ICD-10-CM | POA: Diagnosis present

## 2016-07-28 DIAGNOSIS — R1010 Upper abdominal pain, unspecified: Secondary | ICD-10-CM

## 2016-07-28 DIAGNOSIS — R740 Nonspecific elevation of levels of transaminase and lactic acid dehydrogenase [LDH]: Secondary | ICD-10-CM | POA: Insufficient documentation

## 2016-07-28 DIAGNOSIS — Z91010 Allergy to peanuts: Secondary | ICD-10-CM

## 2016-07-28 DIAGNOSIS — R7989 Other specified abnormal findings of blood chemistry: Secondary | ICD-10-CM | POA: Diagnosis present

## 2016-07-28 DIAGNOSIS — Z7982 Long term (current) use of aspirin: Secondary | ICD-10-CM | POA: Insufficient documentation

## 2016-07-28 DIAGNOSIS — R55 Syncope and collapse: Secondary | ICD-10-CM | POA: Diagnosis not present

## 2016-07-28 DIAGNOSIS — R1011 Right upper quadrant pain: Secondary | ICD-10-CM | POA: Diagnosis not present

## 2016-07-28 DIAGNOSIS — R0789 Other chest pain: Secondary | ICD-10-CM | POA: Diagnosis not present

## 2016-07-28 DIAGNOSIS — Z8673 Personal history of transient ischemic attack (TIA), and cerebral infarction without residual deficits: Secondary | ICD-10-CM | POA: Diagnosis not present

## 2016-07-28 DIAGNOSIS — K760 Fatty (change of) liver, not elsewhere classified: Secondary | ICD-10-CM | POA: Diagnosis not present

## 2016-07-28 DIAGNOSIS — K429 Umbilical hernia without obstruction or gangrene: Secondary | ICD-10-CM | POA: Insufficient documentation

## 2016-07-28 DIAGNOSIS — K759 Inflammatory liver disease, unspecified: Secondary | ICD-10-CM | POA: Diagnosis present

## 2016-07-28 DIAGNOSIS — R51 Headache: Secondary | ICD-10-CM | POA: Insufficient documentation

## 2016-07-28 DIAGNOSIS — Z791 Long term (current) use of non-steroidal anti-inflammatories (NSAID): Secondary | ICD-10-CM | POA: Insufficient documentation

## 2016-07-28 DIAGNOSIS — R7401 Elevation of levels of liver transaminase levels: Secondary | ICD-10-CM

## 2016-07-28 DIAGNOSIS — Z8774 Personal history of (corrected) congenital malformations of heart and circulatory system: Secondary | ICD-10-CM | POA: Diagnosis not present

## 2016-07-28 DIAGNOSIS — K219 Gastro-esophageal reflux disease without esophagitis: Secondary | ICD-10-CM | POA: Insufficient documentation

## 2016-07-28 DIAGNOSIS — Z79899 Other long term (current) drug therapy: Secondary | ICD-10-CM | POA: Insufficient documentation

## 2016-07-28 DIAGNOSIS — M549 Dorsalgia, unspecified: Secondary | ICD-10-CM

## 2016-07-28 DIAGNOSIS — R112 Nausea with vomiting, unspecified: Secondary | ICD-10-CM | POA: Insufficient documentation

## 2016-07-28 DIAGNOSIS — R109 Unspecified abdominal pain: Secondary | ICD-10-CM

## 2016-07-28 DIAGNOSIS — R945 Abnormal results of liver function studies: Secondary | ICD-10-CM | POA: Diagnosis present

## 2016-07-28 DIAGNOSIS — R079 Chest pain, unspecified: Secondary | ICD-10-CM

## 2016-07-28 LAB — ACETAMINOPHEN LEVEL: Acetaminophen (Tylenol), Serum: <10 ug/mL — ABNORMAL LOW (ref 10–30)

## 2016-07-28 LAB — COMPREHENSIVE METABOLIC PANEL
ALK PHOS: 142 U/L — AB (ref 38–126)
ALT: 619 U/L — ABNORMAL HIGH (ref 14–54)
ALT: 833 U/L — AB (ref 14–54)
ANION GAP: 7 (ref 5–15)
AST: 978 U/L — ABNORMAL HIGH (ref 15–41)
AST: 649 U/L — ABNORMAL HIGH (ref 15–41)
Albumin: 3.9 g/dL (ref 3.5–5.0)
Albumin: 4.3 g/dL (ref 3.5–5.0)
Alkaline Phosphatase: 97 U/L (ref 38–126)
Anion gap: 9 (ref 5–15)
BUN: 19 mg/dL (ref 6–20)
BUN: 15 mg/dL (ref 6–20)
CALCIUM: 8.7 mg/dL — AB (ref 8.9–10.3)
CALCIUM: 8.9 mg/dL (ref 8.9–10.3)
CO2: 22 mmol/L (ref 22–32)
CO2: 24 mmol/L (ref 22–32)
CREATININE: 0.9 mg/dL (ref 0.44–1.00)
Chloride: 108 mmol/L (ref 101–111)
Chloride: 107 mmol/L (ref 101–111)
Creatinine, Ser: 0.83 mg/dL (ref 0.44–1.00)
GFR calc non Af Amer: >60 mL/min (ref >60)
Glucose, Bld: 113 mg/dL — ABNORMAL HIGH (ref 65–99)
Glucose, Bld: 106 mg/dL — ABNORMAL HIGH (ref 65–99)
POTASSIUM: 3.7 mmol/L (ref 3.5–5.1)
Potassium: 3.9 mmol/L (ref 3.5–5.1)
SODIUM: 139 mmol/L (ref 135–145)
SODIUM: 138 mmol/L (ref 135–145)
TOTAL PROTEIN: 7.3 g/dL (ref 6.5–8.1)
Total Bilirubin: 1.8 mg/dL — ABNORMAL HIGH (ref 0.3–1.2)
Total Bilirubin: 1.5 mg/dL — ABNORMAL HIGH (ref 0.3–1.2)
Total Protein: 7 g/dL (ref 6.5–8.1)

## 2016-07-28 LAB — URINALYSIS, ROUTINE W REFLEX MICROSCOPIC
Bilirubin Urine: NEGATIVE
Glucose, UA: NEGATIVE mg/dL
Hgb urine dipstick: NEGATIVE
Ketones, ur: NEGATIVE mg/dL
Leukocytes, UA: NEGATIVE
Nitrite: NEGATIVE
Protein, ur: NEGATIVE mg/dL
pH: 5 (ref 5.0–8.0)

## 2016-07-28 LAB — HCG, QUANTITATIVE, PREGNANCY: hCG, Beta Chain, Quant, S: <1 m[IU]/mL (ref <5)

## 2016-07-28 LAB — CBC
HCT: 35.7 % — ABNORMAL LOW (ref 36.0–46.0)
HEMOGLOBIN: 12 g/dL (ref 12.0–15.0)
MCH: 28.2 pg (ref 26.0–34.0)
MCHC: 33.6 g/dL (ref 30.0–36.0)
MCV: 83.8 fL (ref 78.0–100.0)
Platelets: 222 10*3/uL (ref 150–400)
RBC: 4.26 MIL/uL (ref 3.87–5.11)
RDW: 13.6 % (ref 11.5–15.5)
WBC: 8.4 10*3/uL (ref 4.0–10.5)

## 2016-07-28 LAB — I-STAT TROPONIN, ED: TROPONIN I, POC: 0 ng/mL (ref 0.00–0.08)

## 2016-07-28 LAB — TROPONIN I: Troponin I: <0.03 ng/mL (ref <0.03)

## 2016-07-28 LAB — LIPASE, BLOOD: LIPASE: 30 U/L (ref 11–51)

## 2016-07-28 MED ORDER — ONDANSETRON HCL 4 MG/2ML IJ SOLN
4.0000 mg | Freq: Once | INTRAMUSCULAR | Status: AC
  Administered 2016-07-28: 4 mg via INTRAVENOUS
  Filled 2016-07-28: qty 2

## 2016-07-28 MED ORDER — SODIUM CHLORIDE 0.9 % IV BOLUS (SEPSIS)
500.0000 mL | Freq: Once | INTRAVENOUS | Status: AC
  Administered 2016-07-28: 500 mL via INTRAVENOUS

## 2016-07-28 MED ORDER — SUCRALFATE 1 G PO TABS: 1.0000 g | ORAL_TABLET | Freq: Three times a day (TID) | ORAL | 0 refills | Status: AC

## 2016-07-28 MED ORDER — IOPAMIDOL (ISOVUE-300) INJECTION 61%
INTRAVENOUS | Status: AC
  Administered 2016-07-28: 100 mL
  Filled 2016-07-28: qty 100

## 2016-07-28 MED ORDER — HYOSCYAMINE SULFATE 0.5 MG/ML IJ SOLN
0.1250 mg | Freq: Once | INTRAMUSCULAR | Status: AC
  Administered 2016-07-28: 0.125 mg via INTRAVENOUS
  Filled 2016-07-28: qty 0.25

## 2016-07-28 MED ORDER — ONDANSETRON HCL 4 MG PO TABS: 4.0000 mg | ORAL_TABLET | Freq: Four times a day (QID) | ORAL | 0 refills | Status: DC

## 2016-07-28 MED ORDER — FENTANYL CITRATE (PF) 100 MCG/2ML IJ SOLN
50.0000 ug | Freq: Once | INTRAMUSCULAR | Status: AC
  Administered 2016-07-28: 50 ug via INTRAVENOUS
  Filled 2016-07-28: qty 2

## 2016-07-28 NOTE — ED Notes (Signed)
Patient transported to CT 

## 2016-07-28 NOTE — ED Notes (Signed)
Pt reports feeling dizzy and that her vision is blurred after receiving fentanyl.

## 2016-07-28 NOTE — H&P (Signed)
History and Physical    Desiree Martin ZOX:[id number] DOB: 1981-01-23 DOA: 07/28/2016  PCP: Patient, No Pcp Per   Patient coming from: Home.  I have personally briefly reviewed patient's old medical records in [facility]  Chief Complaint: I passed out in the shower.  HPI: Desiree Martin is a 36 y.o. female with medical history significant of history of atrial septal defect repair, GERD, stroke who is brought for the second time today to the emergency department due to syncopal episode. He was initially seen for abdominal pain, nausea and emesis. Workup showed hyperbilirubinemia and transaminitis, but was otherwise reassuring. She was discharged home and while taking a shower lost consciousness briefly. There was no head trauma or other apparent injury during this incident. She initially felt lightheaded, but states that this is better after IV fluids were given. She complains of chest discomfort, pressure and burning like, that radiates from the stomach without exacerbating or relieving factors. She denies history of PND, orthopnea or pitting edema of the lower extremities.  ED Course: Initial vital signs were 97.60F, pulse 42, respirations 18, blood pressure 78/69 and O2 saturation 99% on room air. She received 500 mL and normal saline bolus, Zofran 4 mg and fentanyl 50 g in the emergency department. EKG shows sinus bradycardia and a troponin level was normal. WBC 7.4, hemoglobin 10.9 g/dL and platelets 811. Sodium 138, potassium 3.9, chloride 107 and bicarbonate 24 mmol/L. BUN was 15, creatinine 0.9 and glucose 106 mg/dL.  Imaging: Abdominal ultrasound and CT scan abdomen/pelvis done earlier did not show any acute findings. Please see images and full radiology reports for further details.  Review of Systems: As per HPI otherwise 10 point review of systems negative.    Past Medical History:  Diagnosis Date  . ASD (atrial septal defect)   . GERD (gastroesophageal reflux disease)   .  Stroke [name])     Past Surgical History:  Procedure Laterality Date  . ASD REPAIR       reports that she has never smoked. She has never used smokeless tobacco. She reports that she does not drink alcohol or use drugs.  Allergies  Allergen Reactions  . Peanut Butter Flavor     Throat swelling      Prior to Admission medications   Medication Sig Start Date End Date Taking? Authorizing Provider  famotidine (PEPCID) 20 MG tablet Take 20 mg by mouth 2 (two) times daily.   Yes [provider]  naproxen sodium (ANAPROX) 220 MG tablet Take 220-440 mg by mouth 2 (two) times daily as needed (pain).   Yes [provider]  ondansetron (ZOFRAN) 4 MG tablet Take 1 tablet (4 mg total) by mouth every 6 (six) hours. 07/28/16   Desiree Helper, PA-C  sucralfate (CARAFATE) 1 g tablet Take 1 tablet (1 g total) by mouth 4 (four) times daily -  with meals and at bedtime. 07/28/16   Desiree Helper, PA-C    Physical Exam: Vitals:   07/28/16 2041 07/28/16 2115 07/28/16 2206  BP: (!) 78/69 (!) 144/91 121/77  Pulse: (!) 42  (!) 46  Resp: 18 16 15   Temp: 97.7 F (36.5 C)    TempSrc: Oral    SpO2: 99%  100%    Constitutional: NAD, calm, comfortable Eyes: PERRL, lids and conjunctivae normal ENMT: Mucous membranes are dry. Posterior pharynx clear of any exudate or lesions. Neck: normal, supple, no masses, no thyromegaly Respiratory: clear to auscultation bilaterally, no wheezing, no crackles. Normal respiratory effort. No  accessory muscle use.  Cardiovascular: Bradycardic at 48 BPM, no murmurs / rubs / gallops. No extremity edema. 2+ pedal pulses. No carotid bruits.  Abdomen: Mild RUQ and suprapubic tenderness, no guarding or rebound/masses palpated. No hepatosplenomegaly. Bowel sounds positive.  Musculoskeletal: no clubbing / cyanosis. No joint deformity upper and lower extremities. Good ROM, no contractures. Normal muscle tone.  Skin: no rashes, lesions, ulcers on limited skin  exam. Neurologic: CN 2-12 grossly intact. Sensation intact, DTR normal. Strength 5/5 in all 4.  Psychiatric: Normal judgment and insight. Alert and oriented x 3. Normal mood.    Labs on Admission: I have personally reviewed following labs and imaging studies  CBC:  Recent Labs Lab 07/28/16 0605  WBC 8.4  HGB 12.0  HCT 35.7*  MCV 83.8  PLT 222   Basic Metabolic Panel:  Recent Labs Lab 07/28/16 0605 07/28/16 2109  NA 139 138  K 3.7 3.9  CL 108 107  CO2 22 24  GLUCOSE 113* 106*  BUN 19 15  CREATININE 0.83 0.90  CALCIUM 8.7* 8.9   GFR: Estimated Creatinine Clearance: 86.9 mL/min (by C-G formula based on SCr of 0.9 mg/dL). Liver Function Tests:  Recent Labs Lab 07/28/16 0605 07/28/16 2109  AST 978* 649*  ALT 619* 833*  ALKPHOS 97 142*  BILITOT 1.8* 1.5*  PROT 7.0 7.3  ALBUMIN 3.9 4.3    Recent Labs Lab 07/28/16 0605  LIPASE 30   No results for input(s): AMMONIA in the last 168 hours. Coagulation Profile: No results for input(s): INR, PROTIME in the last 168 hours. Cardiac Enzymes:  Recent Labs Lab 07/28/16 0605  TROPONINI <0.03   BNP (last 3 results) No results for input(s): PROBNP in the last 8760 hours. HbA1C: No results for input(s): HGBA1C in the last 72 hours. CBG: No results for input(s): GLUCAP in the last 168 hours. Lipid Profile: No results for input(s): CHOL, HDL, LDLCALC, TRIG, CHOLHDL, LDLDIRECT in the last 72 hours. Thyroid Function Tests: No results for input(s): TSH, T4TOTAL, FREET4, T3FREE, THYROIDAB in the last 72 hours. Anemia Panel: No results for input(s): VITAMINB12, FOLATE, FERRITIN, TIBC, IRON, RETICCTPCT in the last 72 hours. Urine analysis:    Component Value Date/Time   COLORURINE AMBER (A) 07/28/2016 0754   APPEARANCEUR CLEAR 07/28/2016 0754   LABSPEC >1.046 (H) 07/28/2016 0754   PHURINE 5.0 07/28/2016 0754   GLUCOSEU NEGATIVE 07/28/2016 0754   HGBUR NEGATIVE 07/28/2016 0754   BILIRUBINUR NEGATIVE 07/28/2016  0754   KETONESUR NEGATIVE 07/28/2016 0754   PROTEINUR NEGATIVE 07/28/2016 0754   UROBILINOGEN 0.2 07/28/2013 0628   NITRITE NEGATIVE 07/28/2016 0754   LEUKOCYTESUR NEGATIVE 07/28/2016 0754    Radiological Exams on Admission: Dg Chest 2 View  Result Date: 07/28/2016 CLINICAL DATA:  Status post syncope, with generalized chest discomfort and bradycardia. Initial encounter. EXAM: CHEST  2 VIEW COMPARISON:  Chest radiograph performed 11/16/2015 FINDINGS: The lungs are well-aerated and clear. There is no evidence of focal opacification, pleural effusion or pneumothorax. The heart is normal in size; the mediastinal contour is within normal limits. No acute osseous abnormalities are seen. IMPRESSION: No acute cardiopulmonary process seen. Electronically Signed   By: Roanna Raider M.D.   On: 07/28/2016 22:06   Ct Head Wo Contrast  Result Date: 07/28/2016 CLINICAL DATA:  Syncope. Nausea and vomiting. Fall 1 day prior with headache EXAM: CT HEAD WITHOUT CONTRAST TECHNIQUE: Contiguous axial images were obtained from the base of the skull through the vertex without intravenous contrast. COMPARISON:  None. FINDINGS:  Brain: The ventricles are normal in size and configuration. There is no intracranial mass, hemorrhage, extra-axial fluid collection, or midline shift. Gray-white compartments are normal. No acute infarct evident. Vascular: There is no appreciable hyperdense vessel. There is no appreciable vascular calcification. Skull: Bony calvarium appears intact. Sinuses/Orbits: There is slight mucosal thickening in several ethmoid air cells. Other paranasal sinuses which are visualized clear. Orbits appear symmetric bilaterally. Other: Mastoid air cells are clear. IMPRESSION: Slight ethmoid sinus disease. No intracranial mass, hemorrhage, or extra-axial fluid collection. Gray-white compartments appear normal. Electronically Signed   By: Bretta BangWilliam  Woodruff III M.D.   On: 07/28/2016 08:51   Ct Abdomen Pelvis W  Contrast  Result Date: 07/28/2016 CLINICAL DATA:  Generalized abdominal and back pain for 3 days. Nausea and vomiting. Syncopal episode yesterday with fall. EXAM: CT ABDOMEN AND PELVIS WITH CONTRAST TECHNIQUE: Multidetector CT imaging of the abdomen and pelvis was performed using the standard protocol following bolus administration of intravenous contrast. CONTRAST:  100 ml Isovue-300. COMPARISON:  07/28/2013 and examination performed [facility]. FINDINGS: Lower chest: Clear lung bases. No significant pleural or pericardial effusion. There is mild bibasilar atelectasis. ASD closure device noted. Hepatobiliary: Subjective mild steatosis. No focal lesion or abnormal enhancement. No evidence of gallstones, gallbladder wall thickening or biliary dilatation. Pancreas: Unremarkable. No pancreatic ductal dilatation or surrounding inflammatory changes. Spleen: Normal in size without focal abnormality. Adrenals/Urinary Tract: Both adrenal glands appear normal. The kidneys appear normal without evidence of urinary tract calculus, suspicious lesion or hydronephrosis. No bladder abnormalities are seen. Contrast material is present in the bladder from the earlier CT. Stomach/Bowel: No evidence of bowel wall thickening, distention or surrounding inflammatory change. The appendix appears normal. Vascular/Lymphatic: There are no enlarged abdominal or pelvic lymph nodes. No significant vascular findings are present. Reproductive: The uterus and ovaries appear unremarkable. Other: Stable tiny umbilical hernia containing only fat. No ascites. Musculoskeletal: No acute or significant osseous findings. IMPRESSION: No acute findings or explanation for the patient's symptoms. Electronically Signed   By: Carey BullocksWilliam  Veazey M.D.   On: 07/28/2016 11:12   [place] Abdomen Limited  Result Date: 07/28/2016 CLINICAL DATA:  Acute right upper quadrant abdominal pain. EXAM: ULTRASOUND ABDOMEN LIMITED RIGHT UPPER  QUADRANT COMPARISON:  CT scan of July 28, 2013. FINDINGS: Gallbladder: No gallstones or wall thickening visualized. No sonographic Murphy sign noted by sonographer. Mild amount of sludge is noted within the gallbladder lumen. Common bile duct: Diameter: 4 mm which is within limits. Liver: No focal lesion identified. Increased echogenicity of hepatic parenchyma is noted consistent with fatty infiltration. IMPRESSION: Sludge is noted within the gallbladder lumen. Fatty infiltration of the liver. No other abnormality seen in the right upper quadrant of the abdomen. Electronically Signed   By: Lupita RaiderJames  Green Jr, M.D.   On: 07/28/2016 09:18    EKG: Independently reviewed. Vent. rate 42 BPM PR interval * ms QRS duration 92 ms QT/QTc 479/401 ms P-R-T axes 50 57 54 Sinus bradycardia Abnormal R-wave progression, early transition  Assessment/Plan Principal Problem:   Syncope Likely vasovagal induced bradycardia due to RUQ pain. However, the patient has a history of ASD repair. Continue cardiac monitoring. Trend troponin levels. Check echocardiogram in the morning.  Active Problems:   Chest pain Pain seems to be atypical. Trend troponin levels. Check echocardiogram in a.m. Given her history of ASD repair, I suggested to the patient to establish with a primary cardiologist.    GERD (gastroesophageal reflux disease) Protonix 40 mg IVP every  24 hours.    Abnormal liver function tests Keep nothing by mouth for now. The patient declined MRCP as she is not sure if she was told that this would be contraindicated after her atrial septal defect repair surgery. 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 Follow-up LFTs in the morning.  Check lipase in the morning.  Consider GI evaluation if no improvement.    DVT prophylaxis: Heparin SQ. Code Status: Full code. Family Communication:  Disposition Plan: Admit for overnight observation and LFTs follow-up. Consults called:  Admission status:  Observation/telemetry.   Bobette Mo MD [facility] Pager [phone]  If 7PM-7AM, please contact night-coverage [email or web address] Password [facility]  07/28/2016, 11:16 PM

## 2016-07-28 NOTE — ED Notes (Signed)
Pt states she is having pain right below her right breast in her chest area. She also states "this nausea is worse than when I was pregnant"

## 2016-07-28 NOTE — ED Provider Notes (Signed)
Patient signed out to me at the beginning of shift. Patient is here with diffuse abdominal pain ongoing for the past 3 days with associate nausea and vomiting. She also report 3 episodes of syncope when the pain is intense. She has history of atrial septal defect status post ASD repair. She does report having occasional left-sided chest pain during these episode. 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 She was seen at [facility] ER yesterday for her complaint but states that no definitive diagnosis made and patient was sent home prematurely. She still having pain at this time.  Tylenol level was checked today and was normal. Urine shows no signs of urine tract infection. Lipase is normal. Electrolytes are mostly reassuring except for evidence of transaminitis with AST 978, ALT 619 and normal alkaline phosphatase. Her total bili is 1.8. Patient mentioned that she was told that she has elevated liver enzyme in the past. She denies alcohol abuse. A white count is normal. Normal troponin level. An abdominal ultrasound obtained showing sludge noted within the gallbladder lumen. Evidence of fatty infiltration in the liver but other concerning feature. Head CT scan without acute finding. EKG with sinus arrhythmia but no concerning feature.  Will obtain abdominal and pelvic CT scan for further evaluation. Patient voiced concern of her symptoms and her recurrent syncopes. Will check orthostatic vital sign.  BP 100/63 (BP Location: Left Arm)   Pulse 62   Temp 97.7 F (36.5 C) (Oral)   Resp 18   Ht 5\' 4"  (1.626 m)   Wt 77.1 kg (170 lb)   SpO2 97%   BMI 29.18 kg/m   Orthostatic Lying   BP- Lying: 95/50  Pulse- Lying:  47      Orthostatic Sitting  BP- Sitting: 112/84  Pulse- Sitting: 57      Orthostatic Standing at 0 minutes  BP- Standing at 0 minutes: 119/88  Pulse- Standing at 0 minutes: 62   11:35 AM Pregnancy test is negative. Abdominal and pelvis CT scan  without any acute finding. The portion of the lower chest on CT scan show clear lung bases, no significant pleural pericardial effusion. There is mild bibasilar atelectasis, ASD closure device is noted.  12:02 PM Labs finding and CT scanning was discussed with patient. At this time, I encourage patient to follow-up with her GI specialist for further evaluation of her transaminitis. Hepatitis panel has been sent but the results have not come back. I also mentioned evidence of mild bradycardia noted on her EKG. This may contribute to her syncopal episodes, however think is more related to pain. At this time, patient felt comfortable going home and follow up outpatient. She understands to return if her condition worsened. All questions answer to patient's satisfaction.  BP 109/66 (BP Location: Left Arm)   Pulse 60   Temp 97.7 F (36.5 C) (Oral)   Resp 18   Ht 5\' 4"  (1.626 m)   Wt 77.1 kg (170 lb)   SpO2 98%   BMI 29.18 kg/m   Results for orders placed or performed during the hospital encounter of 07/28/16  Lipase, blood  Result Value Ref Range   Lipase 30 11 - 51 U/L  Comprehensive metabolic panel  Result Value Ref Range   Sodium 139 135 - 145 mmol/L   Potassium 3.7 3.5 - 5.1 mmol/L   Chloride 108 101 - 111 mmol/L   CO2 22 22 - 32 mmol/L   Glucose, Bld 113 (H) 65 - 99  mg/dL   BUN 19 6 - 20 mg/dL   Creatinine, Ser 1.61 0.44 - 1.00 mg/dL   Calcium 8.7 (L) 8.9 - 10.3 mg/dL   Total Protein 7.0 6.5 - 8.1 g/dL   Albumin 3.9 3.5 - 5.0 g/dL   AST 096 (H) 15 - 41 U/L   ALT 619 (H) 14 - 54 U/L   Alkaline Phosphatase 97 38 - 126 U/L   Total Bilirubin 1.8 (H) 0.3 - 1.2 mg/dL   GFR calc non Af Amer >60 >60 mL/min   GFR calc Af Amer >60 >60 mL/min   Anion gap 9 5 - 15  CBC  Result Value Ref Range   WBC 8.4 4.0 - 10.5 K/uL   RBC 4.26 3.87 - 5.11 MIL/uL   Hemoglobin 12.0 12.0 - 15.0 g/dL   HCT 04.5 (L) 40.9 - 81.1 %   MCV 83.8 78.0 - 100.0 fL   MCH 28.2 26.0 - 34.0 pg   MCHC 33.6 30.0 -  36.0 g/dL   RDW 91.4 78.2 - 95.6 %   Platelets 222 150 - 400 K/uL  Urinalysis, Routine w reflex microscopic  Result Value Ref Range   Color, Urine AMBER (A) YELLOW   APPearance CLEAR CLEAR   Specific Gravity, Urine >1.046 (H) 1.005 - 1.030   pH 5.0 5.0 - 8.0   Glucose, UA NEGATIVE NEGATIVE mg/dL   Hgb urine dipstick NEGATIVE NEGATIVE   Bilirubin Urine NEGATIVE NEGATIVE   Ketones, ur NEGATIVE NEGATIVE mg/dL   Protein, ur NEGATIVE NEGATIVE mg/dL   Nitrite NEGATIVE NEGATIVE   Leukocytes, UA NEGATIVE NEGATIVE  Troponin I  Result Value Ref Range   Troponin I <0.03 <0.03 ng/mL  Acetaminophen level  Result Value Ref Range   Acetaminophen (Tylenol), Serum <10 (L) 10 - 30 ug/mL  hCG, quantitative, pregnancy  Result Value Ref Range   hCG, Beta Chain, Quant, S <1 <5 mIU/mL   Ct Head Wo Contrast  Result Date: 07/28/2016 CLINICAL DATA:  Syncope. Nausea and vomiting. Fall 1 day prior with headache EXAM: CT HEAD WITHOUT CONTRAST TECHNIQUE: Contiguous axial images were obtained from the base of the skull through the vertex without intravenous contrast. COMPARISON:  None. FINDINGS: Brain: The ventricles are normal in size and configuration. There is no intracranial mass, hemorrhage, extra-axial fluid collection, or midline shift. Gray-white compartments are normal. No acute infarct evident. Vascular: There is no appreciable hyperdense vessel. There is no appreciable vascular calcification. Skull: Bony calvarium appears intact. Sinuses/Orbits: There is slight mucosal thickening in several ethmoid air cells. Other paranasal sinuses which are visualized clear. Orbits appear symmetric bilaterally. Other: Mastoid air cells are clear. IMPRESSION: Slight ethmoid sinus disease. No intracranial mass, hemorrhage, or extra-axial fluid collection. Gray-white compartments appear normal. Electronically Signed   By: Bretta Bang III M.D.   On: 07/28/2016 08:51   Ct Abdomen Pelvis W Contrast  Result Date:  07/28/2016 CLINICAL DATA:  Generalized abdominal and back pain for 3 days. Nausea and vomiting. Syncopal episode yesterday with fall. EXAM: CT ABDOMEN AND PELVIS WITH CONTRAST TECHNIQUE: Multidetector CT imaging of the abdomen and pelvis was performed using the standard protocol following bolus administration of intravenous contrast. CONTRAST:  100 ml Isovue-300. COMPARISON:  07/28/2013 and examination performed earlier today at [facility]. FINDINGS: Lower chest: Clear lung bases. No significant pleural or pericardial effusion. There is mild bibasilar atelectasis. ASD closure device noted. Hepatobiliary: Subjective mild steatosis. No focal lesion or abnormal enhancement. No evidence of gallstones, gallbladder wall thickening or  biliary dilatation. Pancreas: Unremarkable. No pancreatic ductal dilatation or surrounding inflammatory changes. Spleen: Normal in size without focal abnormality. Adrenals/Urinary Tract: Both adrenal glands appear normal. The kidneys appear normal without evidence of urinary tract calculus, suspicious lesion or hydronephrosis. No bladder abnormalities are seen. Contrast material is present in the bladder from the earlier CT. Stomach/Bowel: No evidence of bowel wall thickening, distention or surrounding inflammatory change. The appendix appears normal. Vascular/Lymphatic: There are no enlarged abdominal or pelvic lymph nodes. No significant vascular findings are present. Reproductive: The uterus and ovaries appear unremarkable. Other: Stable tiny umbilical hernia containing only fat. No ascites. Musculoskeletal: No acute or significant osseous findings. IMPRESSION: No acute findings or explanation for the patient's symptoms. Electronically Signed   By: Carey BullocksWilliam  Veazey M.D.   On: 07/28/2016 11:12   [place] Abdomen Limited  Result Date: 07/28/2016 CLINICAL DATA:  Acute right upper quadrant abdominal pain. EXAM: ULTRASOUND ABDOMEN LIMITED RIGHT UPPER QUADRANT COMPARISON:  CT scan  of July 28, 2013. FINDINGS: Gallbladder: No gallstones or wall thickening visualized. No sonographic Murphy sign noted by sonographer. Mild amount of sludge is noted within the gallbladder lumen. Common bile duct: Diameter: 4 mm which is within limits. Liver: No focal lesion identified. Increased echogenicity of hepatic parenchyma is noted consistent with fatty infiltration. IMPRESSION: Sludge is noted within the gallbladder lumen. Fatty infiltration of the liver. No other abnormality seen in the right upper quadrant of the abdomen. Electronically Signed   By: Lupita RaiderJames  Green Jr, M.D.   On: 07/28/2016 09:18      Fayrene Helperran, Jerrika Ledlow, PA-C 07/28/16 1209    Lavera GuiseLiu, Dana Duo, MD 07/29/16 [phone]

## 2016-07-28 NOTE — Discharge Instructions (Signed)
You have been evaluated for your abdominal pain.  Your liver enzyme is elevated and will need further evaluation by a primary care provider or GI specialist.  Please call and follow up for further care.  Take medications as prescribed.  Return if your condition worsen or if you have other concerns.

## 2016-07-28 NOTE — ED Notes (Signed)
Pt unable to give urine sample, but aware that we need one.  

## 2016-07-28 NOTE — ED Provider Notes (Signed)
[facility]-EMERGENCY DEPT Provider Note   CSN: [id number] Arrival date & time: 07/28/16  2020     History   Chief Complaint Chief Complaint  Patient presents with  . Loss of Consciousness    HPI Desiree Martin is a 36 y.o. female.  Patient is a 36 year old female who presents after syncopal event. She has a history of an ASD repair in 2005. This was done in [place]. She states for last 3 days she's had some pain in her right upper abdomen and epigastrium as well as in her left chest radiating to her left arm. She sees is been worse over the last 3 hours. She has some nausea but no vomiting. She feels dizzy and weak. She's had several syncopal events throughout the last 24 hours. Most recently she passed out when she was in the shower. She denies any injuries from the fall. No neck or back pain. She has some tenderness across the muscles of her upper shoulders but no spinal pain. She denies history of similar symptoms in the past. She was seen here in emergency department earlier this morning. She had elevated LFTs. Imaging studies of her abdomen including a CT scan and ultrasound did not reveal any acute abnormalities. She went home and was feeling worse with worsening chest pain and dizziness and had the subsequent syncopal episode. She does not have a cardiologist in [place].      Past Medical History:  Diagnosis Date  . ASD (atrial septal defect)   . GERD (gastroesophageal reflux disease)   . Stroke [facility])     Patient Active Problem List   Diagnosis Date Noted  . Syncope 07/28/2016    Past Surgical History:  Procedure Laterality Date  . ASD REPAIR      OB History    No data available       Home Medications    Prior to Admission medications   Medication Sig Start Date End Date Taking? Authorizing Provider  famotidine (PEPCID) 20 MG tablet Take 20 mg by mouth 2 (two) times daily.   Yes [provider]  naproxen sodium (ANAPROX) 220 MG tablet Take 220-440 mg  by mouth 2 (two) times daily as needed (pain).   Yes [provider]  ondansetron (ZOFRAN) 4 MG tablet Take 1 tablet (4 mg total) by mouth every 6 (six) hours. 07/28/16   Fayrene Helper, PA-C  sucralfate (CARAFATE) 1 g tablet Take 1 tablet (1 g total) by mouth 4 (four) times daily -  with meals and at bedtime. 07/28/16   Fayrene Helper, PA-C    Family History History reviewed. No pertinent family history.  Social History Social History  Substance Use Topics  . Smoking status: Never Smoker  . Smokeless tobacco: Never Used  . Alcohol use No     Allergies   Peanut butter flavor   Review of Systems Review of Systems  Constitutional: Positive for fatigue. Negative for chills, diaphoresis and fever.  HENT: Negative for congestion, rhinorrhea and sneezing.   Eyes: Negative.   Respiratory: Positive for shortness of breath. Negative for cough and chest tightness.   Cardiovascular: Positive for chest pain. Negative for leg swelling.  Gastrointestinal: Positive for nausea. Negative for abdominal pain, blood in stool, diarrhea and vomiting.  Genitourinary: Negative for difficulty urinating, flank pain, frequency and hematuria.  Musculoskeletal: Negative for arthralgias and back pain.  Skin: Negative for rash.  Neurological: Positive for light-headedness. Negative for dizziness, speech difficulty, weakness, numbness and headaches.  Physical Exam Updated Vital Signs BP 121/77   Pulse (!) 46   Temp 97.7 F (36.5 C) (Oral)   Resp 15   LMP 07/28/2016   SpO2 100%   Physical Exam  Constitutional: She is oriented to person, place, and time. She appears well-developed and well-nourished.  HENT:  Head: Normocephalic and atraumatic.  Eyes: Pupils are equal, round, and reactive to light.  Neck: Normal range of motion. Neck supple.  Cardiovascular: Regular rhythm and normal heart sounds.  Bradycardia present.   Pulmonary/Chest: Effort normal and breath sounds normal. No respiratory  distress. She has no wheezes. She has no rales. She exhibits no tenderness.  Abdominal: Soft. Bowel sounds are normal. There is tenderness (Tenderness across the upper abdomen). There is no rebound and no guarding.  Musculoskeletal: Normal range of motion. She exhibits no edema.  Lymphadenopathy:    She has no cervical adenopathy.  Neurological: She is alert and oriented to person, place, and time.  Skin: Skin is warm and dry. No rash noted.  Psychiatric: She has a normal mood and affect.     ED Treatments / Results  Labs (all labs ordered are listed, but only abnormal results are displayed) Labs Reviewed  COMPREHENSIVE METABOLIC PANEL - Abnormal; Notable for the following:       Result Value   Glucose, Bld 106 (*)    AST 649 (*)    ALT 833 (*)    Alkaline Phosphatase 142 (*)    Total Bilirubin 1.5 (*)    All other components within normal limits  I-STAT TROPOININ, ED    EKG  EKG Interpretation  Date/Time:  Friday July 28 2016 20:48:35 EDT Ventricular Rate:  42 PR Interval:    QRS Duration: 92 QT Interval:  479 QTC Calculation: 401 R Axis:   57 Text Interpretation:  Sinus bradycardia Abnormal R-wave progression, early transition Confirmed by Rolan BuccoBelfi, Dyneshia Baccam [phone]([id number]) on 07/28/2016 9:16:44 PM       Radiology Dg Chest 2 View  Result Date: 07/28/2016 CLINICAL DATA:  Status post syncope, with generalized chest discomfort and bradycardia. Initial encounter. EXAM: CHEST  2 VIEW COMPARISON:  Chest radiograph performed 11/16/2015 FINDINGS: The lungs are well-aerated and clear. There is no evidence of focal opacification, pleural effusion or pneumothorax. The heart is normal in size; the mediastinal contour is within normal limits. No acute osseous abnormalities are seen. IMPRESSION: No acute cardiopulmonary process seen. Electronically Signed   By: Roanna RaiderJeffery  Chang M.D.   On: 07/28/2016 22:06   Ct Head Wo Contrast  Result Date: 07/28/2016 CLINICAL DATA:  Syncope. Nausea and  vomiting. Fall 1 day prior with headache EXAM: CT HEAD WITHOUT CONTRAST TECHNIQUE: Contiguous axial images were obtained from the base of the skull through the vertex without intravenous contrast. COMPARISON:  None. FINDINGS: Brain: The ventricles are normal in size and configuration. There is no intracranial mass, hemorrhage, extra-axial fluid collection, or midline shift. Gray-white compartments are normal. No acute infarct evident. Vascular: There is no appreciable hyperdense vessel. There is no appreciable vascular calcification. Skull: Bony calvarium appears intact. Sinuses/Orbits: There is slight mucosal thickening in several ethmoid air cells. Other paranasal sinuses which are visualized clear. Orbits appear symmetric bilaterally. Other: Mastoid air cells are clear. IMPRESSION: Slight ethmoid sinus disease. No intracranial mass, hemorrhage, or extra-axial fluid collection. Gray-white compartments appear normal. Electronically Signed   By: Bretta BangWilliam  Woodruff III M.D.   On: 07/28/2016 08:51   Ct Abdomen Pelvis W Contrast  Result Date: 07/28/2016 CLINICAL DATA:  Generalized  abdominal and back pain for 3 days. Nausea and vomiting. Syncopal episode yesterday with fall. EXAM: CT ABDOMEN AND PELVIS WITH CONTRAST TECHNIQUE: Multidetector CT imaging of the abdomen and pelvis was performed using the standard protocol following bolus administration of intravenous contrast. CONTRAST:  100 ml Isovue-300. COMPARISON:  07/28/2013 and examination performed earlier today at [facility]. FINDINGS: Lower chest: Clear lung bases. No significant pleural or pericardial effusion. There is mild bibasilar atelectasis. ASD closure device noted. Hepatobiliary: Subjective mild steatosis. No focal lesion or abnormal enhancement. No evidence of gallstones, gallbladder wall thickening or biliary dilatation. Pancreas: Unremarkable. No pancreatic ductal dilatation or surrounding inflammatory changes. Spleen: Normal in  size without focal abnormality. Adrenals/Urinary Tract: Both adrenal glands appear normal. The kidneys appear normal without evidence of urinary tract calculus, suspicious lesion or hydronephrosis. No bladder abnormalities are seen. Contrast material is present in the bladder from the earlier CT. Stomach/Bowel: No evidence of bowel wall thickening, distention or surrounding inflammatory change. The appendix appears normal. Vascular/Lymphatic: There are no enlarged abdominal or pelvic lymph nodes. No significant vascular findings are present. Reproductive: The uterus and ovaries appear unremarkable. Other: Stable tiny umbilical hernia containing only fat. No ascites. Musculoskeletal: No acute or significant osseous findings. IMPRESSION: No acute findings or explanation for the patient's symptoms. Electronically Signed   By: Carey Bullocks M.D.   On: 07/28/2016 11:12   US Abdomen Limited  Result Date: 07/28/2016 CLINICAL DATA:  Acute right upper quadrant abdominal pain. EXAM: ULTRASOUND ABDOMEN LIMITED RIGHT UPPER QUADRANT COMPARISON:  CT scan of July 28, 2013. FINDINGS: Gallbladder: No gallstones or wall thickening visualized. No sonographic Murphy sign noted by sonographer. Mild amount of sludge is noted within the gallbladder lumen. Common bile duct: Diameter: 4 mm which is within limits. Liver: No focal lesion identified. Increased echogenicity of hepatic parenchyma is noted consistent with fatty infiltration. IMPRESSION: Sludge is noted within the gallbladder lumen. Fatty infiltration of the liver. No other abnormality seen in the right upper quadrant of the abdomen. Electronically Signed   By: Lupita Raider, M.D.   On: 07/28/2016 09:18    Procedures Procedures (including critical care time)  Medications Ordered in ED Medications  sodium chloride 0.9 % bolus 500 mL (500 mLs Intravenous New Bag/Given 07/28/16 2134)  fentaNYL (SUBLIMAZE) injection 50 mcg (50 mcg Intravenous Given 07/28/16 2135)    ondansetron (ZOFRAN) injection 4 mg (4 mg Intravenous Given 07/28/16 2146)     Initial Impression / Assessment and Plan / ED Course  I have reviewed the triage vital signs and the nursing notes.  Pertinent labs & imaging results that were available during my care of the patient were reviewed by me and considered in my medical decision making (see chart for details).     Patient presents as a second visit for ongoing pain to her upper abdomen as well as left-sided chest pain. She's had several syncopal episodes. She's noted to be bradycardic. She was hypotensive on arrival. Her blood pressure has improved but she still bradycardic. It is a sinus bradycardia. Her LFTs are elevated but her imaging studies don't reveal any abnormal findings to her liver or gallbladder. Her troponin is negative. Her EKG doesn't show ischemia. She was given IV fluids. She doesn't report any injuries from the syncopal episodes. She did have a head CT on her prior visit. I feel that she didn't need to be admitted for observation. I did speak with the cardiology fellow on call, Dr. Virgina Organ, who feels that  her bradycardia is most likely secondary to a vagal response. I spoke with Dr. Robb Matar with the hospitalist service to admit the patient.  Final Clinical Impressions(s) / ED Diagnoses   Final diagnoses:  Syncope and collapse  Hepatitis  Bradycardia  Chest pain, unspecified type    New Prescriptions New Prescriptions   No medications on file     Rolan Bucco, MD 07/28/16 2302

## 2016-07-28 NOTE — ED Provider Notes (Signed)
[facility]ER[facility] DEPT Provider Note   CSN: [id number] Arrival date & time: 07/28/16  0509     History   Chief Complaint Chief Complaint  Patient presents with  . Abdominal Pain  . Near Syncope    HPI Desiree Martin is a 36 y.o. female.  Patient presents with complaint of abdominal pain over entire abdomen and correlating back x 3 days. Pain is constant, unaffected by eating or drinking. She denies fever, constipation or diarrhea. She reports nausea and vomiting. The pain is a burning type pain that enters her chest periodically. No dysuria but she reports her urine is dark. She was seen at [facility] yesterday and reports a "contrast study" was done and she was told it was normal. She is also having episodes of syncope that seem to be related to severe pain. No chest pain other than when the burning type abdominal pain radiates. No SOB. She fell during syncopal episode yesterday and hit her head. She reports persistent headache.   The history is provided by the patient. No language interpreter was used.    Past Medical History:  Diagnosis Date  . ASD (atrial septal defect)   . GERD (gastroesophageal reflux disease)   . Stroke [facility](H[facility])     There are no active problems to display for this patient.   Past Surgical History:  Procedure Laterality Date  . ASD REPAIR      OB History    No data available       Home Medications    Prior to Admission medications   Medication Sig Start Date End Date Taking? Authorizing Provider  aspirin EC 81 MG tablet Take 81 mg by mouth daily.    [provider]  HYDROcodone-acetaminophen (NORCO/VICODIN) 5-325 MG per tablet Take 1-2 tablets by mouth every 6 (six) hours as needed. 08/18/14   Ladona MowMintz, Joe, PA-C  omeprazole (PRILOSEC OTC) 20 MG tablet Take 1 tablet (20 mg total) by mouth daily. Patient not taking: Reported on 08/18/2014 07/28/13   Linwood DibblesKnapp, Jon, MD  ondansetron (ZOFRAN) 4 MG tablet Take 1 tablet (4 mg total) by mouth  every 6 (six) hours. Patient not taking: Reported on 08/18/2014 07/28/13   Linwood DibblesKnapp, Jon, MD    Family History History reviewed. No pertinent family history.  Social History Social History  Substance Use Topics  . Smoking status: Never Smoker  . Smokeless tobacco: Never Used  . Alcohol use No     Allergies   Peanut butter flavor   Review of Systems Review of Systems  Constitutional: Negative for chills and fever.  Respiratory: Negative.  Negative for cough and shortness of breath.   Cardiovascular: Negative.   Gastrointestinal: Positive for abdominal pain, nausea and vomiting. Negative for constipation and diarrhea.  Genitourinary: Negative.   Musculoskeletal: Positive for back pain.  Skin: Negative.   Neurological: Positive for syncope and headaches (After fall w/syncope yesterday.).     Physical Exam Updated Vital Signs BP 121/77 (BP Location: Left Arm)   Pulse 78   Temp 97.7 F (36.5 C) (Oral)   Resp 16   Ht 5\' 4"  (1.626 m)   Wt 77.1 kg (170 lb)   SpO2 98%   BMI 29.18 kg/m   Physical Exam  Constitutional: She is oriented to person, place, and time. She appears well-developed and well-nourished.  HENT:  Head: Normocephalic and atraumatic.  No hematoma or bruising.  Neck: Normal range of motion. Neck supple.  Cardiovascular: Normal rate and regular rhythm.   Pulmonary/Chest:  Effort normal and breath sounds normal. She has no wheezes. She has no rales. She exhibits no tenderness.  Abdominal: Soft. Bowel sounds are normal. There is tenderness. There is guarding. There is no rebound.  Abdominal tenderness is diffuse.  Genitourinary:  Genitourinary Comments: No flank tenderness.  Musculoskeletal: Normal range of motion.  No midline cervical tenderness.  Neurological: She is alert and oriented to person, place, and time. She exhibits normal muscle tone. Coordination normal.  Skin: Skin is warm and dry. No rash noted.  Psychiatric: She has a normal mood and affect.      ED Treatments / Results  Labs (all labs ordered are listed, but only abnormal results are displayed) Labs Reviewed  COMPREHENSIVE METABOLIC PANEL - Abnormal; Notable for the following:       Result Value   Glucose, Bld 113 (*)    Calcium 8.7 (*)    AST 978 (*)    ALT 619 (*)    Total Bilirubin 1.8 (*)    All other components within normal limits  CBC - Abnormal; Notable for the following:    HCT 35.7 (*)    All other components within normal limits  LIPASE, BLOOD  URINALYSIS, ROUTINE W REFLEX MICROSCOPIC  POC URINE PREG, ED   Results for orders placed or performed during the hospital encounter of 07/28/16  Lipase, blood  Result Value Ref Range   Lipase 30 11 - 51 U/L  Comprehensive metabolic panel  Result Value Ref Range   Sodium 139 135 - 145 mmol/L   Potassium 3.7 3.5 - 5.1 mmol/L   Chloride 108 101 - 111 mmol/L   CO2 22 22 - 32 mmol/L   Glucose, Bld 113 (H) 65 - 99 mg/dL   BUN 19 6 - 20 mg/dL   Creatinine, Ser 1.61 0.44 - 1.00 mg/dL   Calcium 8.7 (L) 8.9 - 10.3 mg/dL   Total Protein 7.0 6.5 - 8.1 g/dL   Albumin 3.9 3.5 - 5.0 g/dL   AST 096 (H) 15 - 41 U/L   ALT 619 (H) 14 - 54 U/L   Alkaline Phosphatase 97 38 - 126 U/L   Total Bilirubin 1.8 (H) 0.3 - 1.2 mg/dL   GFR calc non Af Amer >60 >60 mL/min   GFR calc Af Amer >60 >60 mL/min   Anion gap 9 5 - 15  CBC  Result Value Ref Range   WBC 8.4 4.0 - 10.5 K/uL   RBC 4.26 3.87 - 5.11 MIL/uL   Hemoglobin 12.0 12.0 - 15.0 g/dL   HCT 04.5 (L) 40.9 - 81.1 %   MCV 83.8 78.0 - 100.0 fL   MCH 28.2 26.0 - 34.0 pg   MCHC 33.6 30.0 - 36.0 g/dL   RDW 91.4 78.2 - 95.6 %   Platelets 222 150 - 400 K/uL  Urinalysis, Routine w reflex microscopic  Result Value Ref Range   Color, Urine AMBER (A) YELLOW   APPearance CLEAR CLEAR   Specific Gravity, Urine >1.046 (H) 1.005 - 1.030   pH 5.0 5.0 - 8.0   Glucose, UA NEGATIVE NEGATIVE mg/dL   Hgb urine dipstick NEGATIVE NEGATIVE   Bilirubin Urine NEGATIVE NEGATIVE   Ketones,  ur NEGATIVE NEGATIVE mg/dL   Protein, ur NEGATIVE NEGATIVE mg/dL   Nitrite NEGATIVE NEGATIVE   Leukocytes, UA NEGATIVE NEGATIVE  Troponin I  Result Value Ref Range   Troponin I <0.03 <0.03 ng/mL    EKG  EKG Interpretation None       Radiology  No results found.  Procedures Procedures (including critical care time)  Medications Ordered in ED Medications  hyoscyamine (LEVSIN) 0.5 MG/ML injection 0.125 mg (not administered)  ondansetron (ZOFRAN) injection 4 mg (not administered)     Initial Impression / Assessment and Plan / ED Course  I have reviewed the triage vital signs and the nursing notes.  Pertinent labs & imaging results that were available during my care of the patient were reviewed by me and considered in my medical decision making (see chart for details).     Patient with abdominal and back pain x 3 days. Also complains of syncope.   She states she was evaluated at another local hospital yesterday and had a normal evaluation, including imaging assumed to be CT abd/pel. No records detected in Care Everywhere. No imaging studies in PACS.  Abdominal pain:  Pain medication provided. Her transaminases are significantly elevated. Will obtain an abd [place] to evaluation gall bladder. VSS. She does not appear toxic. Will continue to observe.   Syncope: EKG shows possible a-fib, however, there is significant artifact and visualized small p-waves. Doubt atrial fibrillation. Repeat EKG ordered. Head CT pending as well.   Patient updated on evaluation.  Patient care signed out to Fayrene Helper, PA-C, for pending review of all results, re-evaluation and appropriate disposition.   Final Clinical Impressions(s) / ED Diagnoses   Final diagnoses:  None   1. Abdominal pain 2. Syncope 3. Elevated liver enzymes 4. Headache  New Prescriptions New Prescriptions   No medications on file     Danne Harbor 07/28/16 0272    Paula Libra, MD 08/01/16 2236

## 2016-07-28 NOTE — ED Triage Notes (Signed)
Pt reports that she began having abd and flank pain tonight along with one episode of syncope. Pt alert and oriented x 4 at this time pt reports that she hit her head when syncopal episode occurred. Pt has child with her at this time.

## 2016-07-28 NOTE — ED Triage Notes (Signed)
Pt is presented urgently with family and pt reporting that she may have passed out in the shower. Seemingly in chest discomfort and cardiac monitoring indicating persistent bradycardia in the lower 40's. Pt is reporting extensive cardiac hx and return precaution of such an event from this mornings visit.

## 2016-07-28 NOTE — ED Notes (Addendum)
Pt stated unable to provide urine specimen at this time.

## 2016-07-29 ENCOUNTER — Encounter (HOSPITAL_COMMUNITY): Payer: Self-pay | Admitting: Internal Medicine

## 2016-07-29 ENCOUNTER — Observation Stay (HOSPITAL_BASED_OUTPATIENT_CLINIC_OR_DEPARTMENT_OTHER): Payer: Medicaid Other

## 2016-07-29 DIAGNOSIS — R945 Abnormal results of liver function studies: Secondary | ICD-10-CM

## 2016-07-29 DIAGNOSIS — K219 Gastro-esophageal reflux disease without esophagitis: Secondary | ICD-10-CM | POA: Diagnosis not present

## 2016-07-29 DIAGNOSIS — R079 Chest pain, unspecified: Secondary | ICD-10-CM

## 2016-07-29 DIAGNOSIS — R1084 Generalized abdominal pain: Secondary | ICD-10-CM | POA: Diagnosis not present

## 2016-07-29 DIAGNOSIS — R001 Bradycardia, unspecified: Secondary | ICD-10-CM

## 2016-07-29 DIAGNOSIS — R55 Syncope and collapse: Secondary | ICD-10-CM

## 2016-07-29 DIAGNOSIS — R109 Unspecified abdominal pain: Secondary | ICD-10-CM

## 2016-07-29 DIAGNOSIS — K759 Inflammatory liver disease, unspecified: Secondary | ICD-10-CM

## 2016-07-29 DIAGNOSIS — R112 Nausea with vomiting, unspecified: Secondary | ICD-10-CM

## 2016-07-29 LAB — LIPASE, BLOOD: Lipase: 23 U/L (ref 11–51)

## 2016-07-29 LAB — CBC WITH DIFFERENTIAL/PLATELET
Basophils Absolute: 0 10*3/uL (ref 0.0–0.1)
Basophils Relative: 0 %
Eosinophils Absolute: 0.1 10*3/uL (ref 0.0–0.7)
Eosinophils Relative: 2 %
HEMATOCRIT: 32.3 % — AB (ref 36.0–46.0)
Hemoglobin: 10.9 g/dL — ABNORMAL LOW (ref 12.0–15.0)
LYMPHS ABS: 2.1 10*3/uL (ref 0.7–4.0)
LYMPHS PCT: 29 %
MCH: 28.3 pg (ref 26.0–34.0)
MCHC: 33.7 g/dL (ref 30.0–36.0)
MCV: 83.9 fL (ref 78.0–100.0)
Monocytes Absolute: 0.5 10*3/uL (ref 0.1–1.0)
Monocytes Relative: 7 %
NEUTROS ABS: 4.6 10*3/uL (ref 1.7–7.7)
Neutrophils Relative %: 62 %
Platelets: 214 10*3/uL (ref 150–400)
RBC: 3.85 MIL/uL — AB (ref 3.87–5.11)
RDW: 13.8 % (ref 11.5–15.5)
WBC: 7.4 10*3/uL (ref 4.0–10.5)

## 2016-07-29 LAB — HEPATITIS PANEL, ACUTE
HCV Ab: <0.1 s/co ratio (ref 0.0–0.9)
HEP A IGM: NEGATIVE
HEP B C IGM: NEGATIVE
Hepatitis B Surface Ag: POSITIVE — AB

## 2016-07-29 LAB — HIV ANTIBODY (ROUTINE TESTING W REFLEX): HIV Screen 4th Generation wRfx: NONREACTIVE

## 2016-07-29 LAB — COMPREHENSIVE METABOLIC PANEL
ALBUMIN: 3.5 g/dL (ref 3.5–5.0)
ALK PHOS: 112 U/L (ref 38–126)
ALT: 614 U/L — AB (ref 14–54)
AST: 342 U/L — ABNORMAL HIGH (ref 15–41)
Anion gap: 4 — ABNORMAL LOW (ref 5–15)
BILIRUBIN TOTAL: 1.3 mg/dL — AB (ref 0.3–1.2)
BUN: 11 mg/dL (ref 6–20)
CO2: 25 mmol/L (ref 22–32)
CREATININE: 0.76 mg/dL (ref 0.44–1.00)
Calcium: 8 mg/dL — ABNORMAL LOW (ref 8.9–10.3)
Chloride: 110 mmol/L (ref 101–111)
GFR calc Af Amer: >60 mL/min (ref >60)
GFR calc non Af Amer: >60 mL/min (ref >60)
GLUCOSE: 85 mg/dL (ref 65–99)
Potassium: 3.5 mmol/L (ref 3.5–5.1)
SODIUM: 139 mmol/L (ref 135–145)
Total Protein: 6.1 g/dL — ABNORMAL LOW (ref 6.5–8.1)

## 2016-07-29 LAB — TROPONIN I
Troponin I: <0.03 ng/mL (ref <0.03)
Troponin I: <0.03 ng/mL (ref <0.03)

## 2016-07-29 LAB — ECHOCARDIOGRAM COMPLETE
Height: 64 in
Weight: 3193.6 oz

## 2016-07-29 LAB — GLUCOSE, CAPILLARY: Glucose-Capillary: 75 mg/dL (ref 65–99)

## 2016-07-29 MED ORDER — SODIUM CHLORIDE 0.9% FLUSH
3.0000 mL | Freq: Two times a day (BID) | INTRAVENOUS | Status: DC
  Administered 2016-07-29: 3 mL via INTRAVENOUS

## 2016-07-29 MED ORDER — KETOROLAC TROMETHAMINE 30 MG/ML IJ SOLN
30.0000 mg | Freq: Once | INTRAMUSCULAR | Status: AC
  Administered 2016-07-29: 30 mg via INTRAVENOUS
  Filled 2016-07-29: qty 1

## 2016-07-29 MED ORDER — ONDANSETRON HCL 4 MG PO TABS: 4.0000 mg | ORAL_TABLET | Freq: Four times a day (QID) | ORAL | 0 refills | Status: DC

## 2016-07-29 MED ORDER — DEXTROSE-NACL 5-0.9 % IV SOLN: INTRAVENOUS | Status: DC

## 2016-07-29 MED ORDER — ONDANSETRON HCL 4 MG/2ML IJ SOLN
4.0000 mg | Freq: Once | INTRAMUSCULAR | Status: AC
  Administered 2016-07-29: 4 mg via INTRAVENOUS
  Filled 2016-07-29: qty 2

## 2016-07-29 MED ORDER — ONDANSETRON HCL 4 MG/2ML IJ SOLN
4.0000 mg | Freq: Four times a day (QID) | INTRAMUSCULAR | Status: DC | PRN
  Administered 2016-07-29: 4 mg via INTRAVENOUS
  Filled 2016-07-29: qty 2

## 2016-07-29 MED ORDER — ONDANSETRON HCL 4 MG PO TABS: 4.0000 mg | ORAL_TABLET | Freq: Three times a day (TID) | ORAL | 0 refills | Status: AC | PRN

## 2016-07-29 MED ORDER — TRAMADOL HCL 50 MG PO TABS: 50.0000 mg | ORAL_TABLET | Freq: Four times a day (QID) | ORAL | 0 refills | Status: AC | PRN

## 2016-07-29 MED ORDER — FENTANYL CITRATE (PF) 100 MCG/2ML IJ SOLN
25.0000 ug | INTRAMUSCULAR | Status: DC | PRN
Start: 2016-07-29 — End: 2016-07-29
  Filled 2016-07-29: qty 2

## 2016-07-29 MED ORDER — SODIUM CHLORIDE 0.9 % IV SOLN
INTRAVENOUS | Status: DC
Start: 2016-07-29 — End: 2016-07-29
  Administered 2016-07-29: 03:00:00 via INTRAVENOUS

## 2016-07-29 MED ORDER — HEPARIN SODIUM (PORCINE) 5000 UNIT/ML IJ SOLN
5000.0000 [IU] | Freq: Three times a day (TID) | INTRAMUSCULAR | Status: DC
  Administered 2016-07-29: 5000 [IU] via SUBCUTANEOUS
  Filled 2016-07-29: qty 1

## 2016-07-29 MED ORDER — TRAMADOL HCL 50 MG PO TABS
50.0000 mg | ORAL_TABLET | Freq: Once | ORAL | Status: AC
Start: 2016-07-29 — End: 2016-07-29
  Administered 2016-07-29: 50 mg via ORAL
  Filled 2016-07-29: qty 1

## 2016-07-29 MED ORDER — PANTOPRAZOLE SODIUM 40 MG IV SOLR
40.0000 mg | Freq: Every day | INTRAVENOUS | Status: DC
  Administered 2016-07-29: 40 mg via INTRAVENOUS
  Filled 2016-07-29: qty 40

## 2016-07-29 MED ORDER — PANTOPRAZOLE SODIUM 40 MG PO TBEC: 40.0000 mg | DELAYED_RELEASE_TABLET | Freq: Every day | ORAL | 0 refills | Status: AC

## 2016-07-29 NOTE — Progress Notes (Signed)
  Echocardiogram 2D Echocardiogram has been performed.  Desiree Martin 07/29/2016, 10:05 AM

## 2016-07-29 NOTE — ED Notes (Signed)
Pt has found somewhere for her child to go. Will take the patient upstairs when the friend gets here to take the child.

## 2016-07-29 NOTE — Discharge Summary (Signed)
Physician Discharge Summary  Desiree Decampriola Solimine AVW:[id number] DOB: 06/08/1980 DOA: 07/28/2016  PCP: Patient, No Pcp Per  Admit date: 07/28/2016 Discharge date: 07/29/2016  Admitted From: Home Disposition:  Home  Recommendations for Outpatient Follow-up:  1. Follow up with and establish with a PCP in 1-2 weeks 2. Follow up with Cardiology as an outpatient for Evaluation of Syncope and Sinus Bradycardia 3. Follow up with Gastroenterology if LFT's not improving or Abdominal Pain is recurrent.  4. Please obtain CMP/CBC, Mag, Phos in one week 5. Please follow up on the following pending results: Acute Hepatitis Panel   Home Health: No  Equipment/Devices: None  Discharge Condition: Stable CODE STATUS: FULL CODE  Diet recommendation: Soft Diet and then Advance to Regular when able too  Brief/Interim Summary: Desiree Martin is a 36 y.o. female with medical history significant of history of atrial septal defect repair, GERD, stroke who is brought for the second time today to the emergency department due to syncopal episode. She was initially seen for abdominal pain, nausea and emesis. Workup showed hyperbilirubinemia and transaminitis, but was otherwise reassuring. She was discharged home and while taking a shower lost consciousness briefly. There was no head trauma or other apparent injury during this incident. She initially felt lightheaded, but states that this is better after IV fluids were given. She complained of chest discomfort, pressure and burning like, that radiates from the stomach without exacerbating or relieving factors. She denies history of PND, orthopnea or pitting edema of the lower extremities. She was worked up and admitted for Syncope, Chest and Abdominal Pain and work up was unrevealing and Abdominal Pain improved. Patient was found to have elevated LFT's that were trending down. It is suspected that the patient had a vasovagal episode from RUQ pain from possibly passing a gallstone. She  remained in Sinus Bradycardia and I discussed the case with Cardiology and because telemetry was unrevealing he recommended that the patient could be discharged and have outpatient workup and Cardiac Evaluation as she was asymptomatic. Patient had orthostatic vital signs done and the patient was not orthostatic. She had some lightheadedness on standing but was able to ambulate steadily. She was deemed medically stable to D/C and will need to follow up and establish with a PCP and with Cardiology as an outpatient and the Cardiology Clinic will call her to schedule an appointment for outpatient evaluation of Sinus Bradycardia. Patient was instructed if that symptoms got worse or changed, she would need to come back to the ED for evaluation.   Discharge Diagnoses:  Principal Problem:   Syncope Active Problems:   Chest pain   GERD (gastroesophageal reflux disease)   Abnormal liver function tests   Nausea and vomiting in adult   Abdominal pain   Hyperbilirubinemia   Sinus bradycardia  Syncope with Collapse -Likely vasovagal induced bradycardia due to RUQ pain. (RUQ improved -Patient has a history of ASD repair. -Continued cardiac monitoring on Telemetry and remained Sinus Bradycardia -Orthostatics done and patient was not orthostatic -Trended troponin levels and were Negative -Obtained ECHOCardiogram which showed The estimated ejection   fraction was in the range of 60% to 65%. Wall motion was normal;   there were no regional wall motion abnormalities -Head CT Done and showed Slight ethmoid sinus disease. No intracranial mass, hemorrhage, or extra-axial fluid collection. Gray-white compartments appear normal -Case Discussed with Dr. Donato SchultzMark Skains of Cardiology who also felt that her bradycardia was 2/2 to a Vagal Response  Sinus Bradycardia -Not on any Betablocker; ?Reactive  from infection vs. Related to Gallbladder -Patient was Asymptomatic and BP remained Stable -Obtained ECHOCardiogram  which showed The estimated ejection   fraction was in the range of 60% to 65%. Wall motion was normal;   there were no regional wall motion abnormalities. Left ventricular diastolic function parameters were normal. -Patient to follow up with Cardiology as an outpatient and an In-Basket Message was sent to Salley Hews of Cardiology to schedule outpatient Appointment -Troponin Negative -Case Discussed with Dr. Donato Schultz of Cardiology  Chest pain -Pain seems to be atypical. -Trended troponin levels and Flat -Checked Echocardiogram and showed normal EF with normal wall motion and no wall motion abnormalities. -Given her history of ASD repair, I suggested to the patient to establish with a primary cardiologist. Shirleen Schirmer Message sent to Salley Hews of Cardiology -Dr. Anne Fu aware and will follow up with Patient as an outpatient  Nausea/Vomiting/Abdominal Pain, improved -? Whether patient had an obstructive stone that she passed -CT Abdomen/Pelvis was normal -U/S of the RUQ showed Sludge is noted within the gallbladder lumen. Fatty infiltration of the liver. No other abnormality seen in the right upper quadrant ofthe abdomen. -Wanted to do MRCP but patient stated she could not do it because of her ASD Closure Device -Wrote for po Zofran and Tramadol at D/C -Start with Clear Liquid Diet and advance to Soft -C/w Sucralfate, Protonix, Pepcid, and Zodran -Follow up with PCP and if still not improving Gastroenterology  GERD (gastroesophageal reflux disease) -Wrote for Protonix 40 mg po Daily -C/w Home Famotidine 20 mg po BID and with Sucralfate 1 gram tablet 4 times daily with meals and bedtime  Abnormal LFT's/Transaminitis; LFT's Trending down -Was Keep nothing by mouth for now and then Diet was advanced to Clears. -CT Abdomen/Pelvis showed Hepatobiliary: Subjective mild steatosis. No focal lesion or abnormal enhancement. No evidence of gallstones, gallbladder wall thickening or biliary  dilatation. The Pancreas: Unremarkable. No pancreatic ductal dilatation or surrounding inflammatory changes. -Abdominal Ultrasound showed Sludge is noted within the gallbladder lumen. Fatty infiltration of the liver. No other abnormality seen in the right upper quadrant ofthe abdomen. -LFT's showed that AST went from 649 -> 342 and ALT went from 833 -> 614 -The patient declined MRCP as she is not sure if she was told that this would be contraindicated after her atrial septal defect repair surgery. 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 -Follow up with PCP for Recheck of CMP  Hyperbilirubinemia -Trending Down -? From passed Gallstone -T Bili went from 1.5 -> 1.3 -Repeat CMP in AM  Discharge Instructions  Discharge Instructions    Call MD for:  difficulty breathing, headache or visual disturbances    Complete by:  As directed    Call MD for:  extreme fatigue    Complete by:  As directed    Call MD for:  hives    Complete by:  As directed    Call MD for:  persistant dizziness or light-headedness    Complete by:  As directed    Call MD for:  persistant nausea and vomiting    Complete by:  As directed    Call MD for:  severe uncontrolled pain    Complete by:  As directed  Call MD for:  temperature >100.4    Complete by:  As directed    Diet - low sodium heart healthy    Complete by:  As directed    Discharge instructions    Complete by:  As directed    Follow up with PCP and with Cardiology as an outpatient. Take all medications as prescribed. If symptoms change or worsen please return to the ED for evaluation   Increase activity slowly    Complete by:  As directed      Allergies as of 07/29/2016       Reactions   Peanut Butter Flavor    Throat swelling       Medication List    TAKE these medications   famotidine 20 MG tablet Commonly known as:  PEPCID Take 20 mg by mouth 2 (two) times daily.   naproxen sodium 220 MG tablet Commonly known as:  ANAPROX Take 220-440 mg by mouth 2 (two) times daily as needed (pain).   ondansetron 4 MG tablet Commonly known as:  ZOFRAN Take 1 tablet (4 mg total) by mouth every 8 (eight) hours as needed for nausea or vomiting. What changed:  when to take this  reasons to take this Notes to patient:  Do not take next dose before 9:00pm tonight   pantoprazole 40 MG tablet Commonly known as:  PROTONIX Take 1 tablet (40 mg total) by mouth daily.   sucralfate 1 g tablet Commonly known as:  CARAFATE Take 1 tablet (1 g total) by mouth 4 (four) times daily -  with meals and at bedtime.   traMADol 50 MG tablet Commonly known as:  ULTRAM Take 1 tablet (50 mg total) by mouth every 6 (six) hours as needed. Notes to patient:  Do not take next dose before 6pm this evening      Follow-up Information    [facility] Follow up.   Why:  Cardiology will be reaching out to you to schedule a follow up and outpatient evaluation for Sinus Bradycardia and possible Holter Event Monitor Contact information: [address] [place] [address] [phone]         Allergies  Allergen Reactions  . Peanut Butter Flavor     Throat swelling    Consultations:  Discussed Case with Cardiology Dr. Donato Schultz via telephone conversation  Procedures/Studies: Dg Chest 2 View  Result Date: 07/28/2016 CLINICAL DATA:  Status post syncope, with generalized chest discomfort and bradycardia. Initial encounter. EXAM: CHEST  2 VIEW COMPARISON:  Chest radiograph performed 11/16/2015 FINDINGS: The lungs are well-aerated and clear. There is no evidence of focal opacification, pleural effusion or  pneumothorax. The heart is normal in size; the mediastinal contour is within normal limits. No acute osseous abnormalities are seen. IMPRESSION: No acute cardiopulmonary process seen. Electronically Signed   By: Roanna Raider M.D.   On: 07/28/2016 22:06   Ct Head Wo Contrast  Result Date: 07/28/2016 CLINICAL DATA:  Syncope. Nausea and vomiting. Fall 1 day prior with headache EXAM: CT HEAD WITHOUT CONTRAST TECHNIQUE: Contiguous axial images were obtained from the base of the skull through the vertex without intravenous contrast. COMPARISON:  None. FINDINGS: Brain: The ventricles are normal in size and configuration. There is no intracranial mass, hemorrhage, extra-axial fluid collection, or midline shift. Gray-white compartments are normal. No acute infarct evident. Vascular: There is no appreciable hyperdense vessel. There is no appreciable vascular calcification. Skull: Bony calvarium appears intact. Sinuses/Orbits: There is  slight mucosal thickening in several ethmoid air cells. Other paranasal sinuses which are visualized clear. Orbits appear symmetric bilaterally. Other: Mastoid air cells are clear. IMPRESSION: Slight ethmoid sinus disease. No intracranial mass, hemorrhage, or extra-axial fluid collection. Gray-white compartments appear normal. Electronically Signed   By: Bretta Bang III M.D.   On: 07/28/2016 08:51   Ct Abdomen Pelvis W Contrast  Result Date: 07/28/2016 CLINICAL DATA:  Generalized abdominal and back pain for 3 days. Nausea and vomiting. Syncopal episode yesterday with fall. EXAM: CT ABDOMEN AND PELVIS WITH CONTRAST TECHNIQUE: Multidetector CT imaging of the abdomen and pelvis was performed using the standard protocol following bolus administration of intravenous contrast. CONTRAST:  100 ml Isovue-300. COMPARISON:  07/28/2013 and examination performed earlier today at [facility]. FINDINGS: Lower chest: Clear lung bases. No significant pleural or pericardial  effusion. There is mild bibasilar atelectasis. ASD closure device noted. Hepatobiliary: Subjective mild steatosis. No focal lesion or abnormal enhancement. No evidence of gallstones, gallbladder wall thickening or biliary dilatation. Pancreas: Unremarkable. No pancreatic ductal dilatation or surrounding inflammatory changes. Spleen: Normal in size without focal abnormality. Adrenals/Urinary Tract: Both adrenal glands appear normal. The kidneys appear normal without evidence of urinary tract calculus, suspicious lesion or hydronephrosis. No bladder abnormalities are seen. Contrast material is present in the bladder from the earlier CT. Stomach/Bowel: No evidence of bowel wall thickening, distention or surrounding inflammatory change. The appendix appears normal. Vascular/Lymphatic: There are no enlarged abdominal or pelvic lymph nodes. No significant vascular findings are present. Reproductive: The uterus and ovaries appear unremarkable. Other: Stable tiny umbilical hernia containing only fat. No ascites. Musculoskeletal: No acute or significant osseous findings. IMPRESSION: No acute findings or explanation for the patient's symptoms. Electronically Signed   By: Carey Bullocks M.D.   On: 07/28/2016 11:12   US Abdomen Limited  Result Date: 07/28/2016 CLINICAL DATA:  Acute right upper quadrant abdominal pain. EXAM: ULTRASOUND ABDOMEN LIMITED RIGHT UPPER QUADRANT COMPARISON:  CT scan of July 28, 2013. FINDINGS: Gallbladder: No gallstones or wall thickening visualized. No sonographic Murphy sign noted by sonographer. Mild amount of sludge is noted within the gallbladder lumen. Common bile duct: Diameter: 4 mm which is within limits. Liver: No focal lesion identified. Increased echogenicity of hepatic parenchyma is noted consistent with fatty infiltration. IMPRESSION: Sludge is noted within the gallbladder lumen. Fatty infiltration of the liver. No other abnormality seen in the right upper quadrant of the abdomen.  Electronically Signed   By: Lupita Raider, M.D.   On: 07/28/2016 09:18   ECHOCARDIOGRAM Study Conclusions  - Left ventricle: The cavity size was normal. Wall thickness was   normal. Systolic function was normal. The estimated ejection   fraction was in the range of 60% to 65%. Wall motion was normal;   there were no regional wall motion abnormalities. Left   ventricular diastolic function parameters were normal. - Mitral valve: There was trivial regurgitation. - Atrial septum: An Amplatzer closure device was present. There was   no atrial level shunt. - Tricuspid valve: There was mild regurgitation.  Subjective: Seen and examined and was feeling better than when she came in. Stated she had no more abdominal Pain or Chest Pain. No SOB. States she was a little lightheaded when she stood up but it improved. No nausea or vomiting and no other complains or concerns and is ready to go home.  Discharge Exam: Vitals:   07/29/16 0530 07/29/16 1018  BP: 109/61 116/62  Pulse: (!) 45 (!) 43  Resp: 18 18  Temp: 98.4 F (36.9 C) 98.3 F (36.8 C)   Vitals:   07/28/16 2345 07/29/16 0214 07/29/16 0530 07/29/16 1018  BP: 115/69 120/67 109/61 116/62  Pulse: (!) 57 (!) 44 (!) 45 (!) 43  Resp: 17 18 18 18   Temp:  98.5 F (36.9 C) 98.4 F (36.9 C) 98.3 F (36.8 C)  TempSrc:  Oral Oral Oral  SpO2: 100% 100%  98%  Weight:  90.5 kg (199 lb 9.6 oz) 90.5 kg (199 lb 9.6 oz)   Height:  5\' 4"  (1.626 m)     General: Pt is alert, awake, not in acute distress Cardiovascular: Bradycardic Rate, S1/S2 +, no rubs, no gallops Respiratory: CTA bilaterally, no wheezing, no rhonchi Abdominal: Soft, Not tender to palpate, ND, bowel sounds + Extremities: no edema, no cyanosis  The results of significant diagnostics from this hospitalization (including imaging, microbiology, ancillary and laboratory) are listed below for reference.    Microbiology: No results found for this or any previous visit (from the  past 240 hour(s)).   Labs: BNP (last 3 results) No results for input(s): BNP in the last 8760 hours. Basic Metabolic Panel:  Recent Labs Lab 07/28/16 0605 07/28/16 2109 07/29/16 0509  NA 139 138 139  K 3.7 3.9 3.5  CL 108 107 110  CO2 22 24 25   GLUCOSE 113* 106* 85  BUN 19 15 11   CREATININE 0.83 0.90 1.61  CALCIUM 8.7* 8.9 8.0*   Liver Function Tests:  Recent Labs Lab 07/28/16 0605 07/28/16 2109 07/29/16 0509  AST 978* 649* 342*  ALT 619* 833* 614*  ALKPHOS 97 142* 112  BILITOT 1.8* 1.5* 1.3*  PROT 7.0 7.3 6.1*  ALBUMIN 3.9 4.3 3.5    Recent Labs Lab 07/28/16 0605 07/29/16 0509  LIPASE 30 23   No results for input(s): AMMONIA in the last 168 hours. CBC:  Recent Labs Lab 07/28/16 0605 07/29/16 0509  WBC 8.4 7.4  NEUTROABS  --  4.6  HGB 12.0 10.9*  HCT 35.7* 32.3*  MCV 83.8 83.9  PLT 222 214   Cardiac Enzymes:  Recent Labs Lab 07/28/16 0605 07/29/16 0509 07/29/16 1000  TROPONINI <0.03 <0.03 <0.03   BNP: Invalid input(s): POCBNP CBG:  Recent Labs Lab 07/29/16 0738  GLUCAP 75   D-Dimer No results for input(s): DDIMER in the last 72 hours. Hgb A1c No results for input(s): HGBA1C in the last 72 hours. Lipid Profile No results for input(s): CHOL, HDL, LDLCALC, TRIG, CHOLHDL, LDLDIRECT in the last 72 hours. Thyroid function studies No results for input(s): TSH, T4TOTAL, T3FREE, THYROIDAB in the last 72 hours.  Invalid input(s): FREET3 Anemia work up No results for input(s): VITAMINB12, FOLATE, FERRITIN, TIBC, IRON, RETICCTPCT in the last 72 hours. Urinalysis    Component Value Date/Time   COLORURINE AMBER (A) 07/28/2016 0754   APPEARANCEUR CLEAR 07/28/2016 0754   LABSPEC >1.046 (H) 07/28/2016 0754   PHURINE 5.0 07/28/2016 0754   GLUCOSEU NEGATIVE 07/28/2016 0754   HGBUR NEGATIVE 07/28/2016 0754   BILIRUBINUR NEGATIVE 07/28/2016 0754   KETONESUR NEGATIVE 07/28/2016 0754   PROTEINUR NEGATIVE 07/28/2016 0754   UROBILINOGEN 0.2  07/28/2013 0628   NITRITE NEGATIVE 07/28/2016 0754   LEUKOCYTESUR NEGATIVE 07/28/2016 0754   Sepsis Labs Invalid input(s): PROCALCITONIN,  WBC,  LACTICIDVEN Microbiology No results found for this or any previous visit (from the past 240 hour(s)).  Time coordinating discharge: 35 minutes  SIGNED:  Merlene Laughter, DO [facility] 07/29/2016, 7:30 PM Pager [phone]  If 7PM-7AM, please contact night-coverage [email or web address] Password [email or web address]

## 2016-08-02 LAB — HEPATITIS PANEL, ACUTE
HEP B C IGM: NEGATIVE
HEP B S AG: POSITIVE — AB
Hep A IgM: NEGATIVE

## 2017-02-15 ENCOUNTER — Other Ambulatory Visit: Payer: Self-pay

## 2017-02-15 ENCOUNTER — Ambulatory Visit: Payer: Medicaid Other | Attending: Physician Assistant | Admitting: Physical Therapy

## 2017-02-15 DIAGNOSIS — R2689 Other abnormalities of gait and mobility: Secondary | ICD-10-CM | POA: Insufficient documentation

## 2017-02-15 DIAGNOSIS — M79602 Pain in left arm: Secondary | ICD-10-CM | POA: Insufficient documentation

## 2017-02-15 DIAGNOSIS — R262 Difficulty in walking, not elsewhere classified: Secondary | ICD-10-CM | POA: Insufficient documentation

## 2017-02-15 DIAGNOSIS — M79605 Pain in left leg: Secondary | ICD-10-CM | POA: Diagnosis present

## 2017-02-15 DIAGNOSIS — M6281 Muscle weakness (generalized): Secondary | ICD-10-CM | POA: Diagnosis not present
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
## 2017-02-23 ENCOUNTER — Encounter: Payer: Self-pay | Admitting: Physical Therapy

## 2017-02-23 ENCOUNTER — Ambulatory Visit: Payer: Medicaid Other | Admitting: Physical Therapy

## 2017-02-23 DIAGNOSIS — M6281 Muscle weakness (generalized): Secondary | ICD-10-CM | POA: Diagnosis not present

## 2017-02-23 DIAGNOSIS — R2689 Other abnormalities of gait and mobility: Secondary | ICD-10-CM

## 2017-02-23 DIAGNOSIS — R262 Difficulty in walking, not elsewhere classified: Secondary | ICD-10-CM

## 2017-02-23 DIAGNOSIS — M79602 Pain in left arm: Secondary | ICD-10-CM

## 2017-02-23 DIAGNOSIS — M79605 Pain in left leg: Secondary | ICD-10-CM

## 2017-02-23 NOTE — Therapy (Signed)
[facility] [address]  [address] [place], [place], [address] Phone: [phone]   Fax:  [phone]  Physical Therapy Treatment  Patient Details  Name: Desiree Martin MRN: [id number] Date of Birth: [birth year] Referring Provider: Lorriane Shire. Maple [place], PA-C   Encounter Date: 02/23/2017  PT End of Session - 02/23/17 0852    Visit Number  2    Number of Visits  4    Date for PT Re-Evaluation  03/08/17    Authorization Type  Medicaid: 02/23/17 - 03/15/17    Authorization - Visit Number  1    Authorization - Number of Visits  3    PT Start Time  0852 pt arrived late    PT Stop Time  0939    PT Time Calculation (min)  47 min    Activity Tolerance  Patient limited by pain;Patient limited by fatigue    Behavior During Therapy  [facility] for tasks assessed/performed       Past Medical History:  Diagnosis Date  . ASD (atrial septal defect)   . GERD (gastroesophageal reflux disease)   . Stroke [facility])     Past Surgical History:  Procedure Laterality Date  . ASD REPAIR      There were no vitals filed for this visit.  Subjective Assessment - 02/23/17 0856    Patient Stated Goals  "to be able to control the pain so I can go get a job & to keep form getting weaker"    Currently in Pain?  Yes    Pain Score  10-Worst pain ever    Pain Location  Arm & leg    Pain Orientation  Left    Pain Descriptors / Indicators  Burning;Sharp;Numbness    Pain Type  Chronic pain    Pain Onset  More than a month ago    Pain Frequency  Constant                      [facility] Adult PT Treatment/Exercise - 02/23/17 0852      Exercises   Exercises  Knee/Hip;Shoulder      Knee/Hip Exercises: Aerobic   Nustep  L1 x 3' - poor tolerance with c/o increased L UE & LE pain despite R UE/LE driving motion of machine      Knee/Hip Exercises: Seated   Heel Slides  Left;5 reps    Heel Slides Limitations  bag over foot to reduce friction on carpet     Ball Squeeze  5x5"      Knee/Hip Exercises: Supine   Quad Sets  Left;5 reps    Quad Sets Limitations  5" hold with towel under knee    Hip Adduction Isometric  Both;5 reps    Hip Adduction Isometric Limitations  3-5" hold     Other Supine Knee/Hip Exercises  Glute sets 5x5"      Shoulder Exercises: Seated   Retraction  Both;5 reps    Retraction Limitations  5" hold      Manual Therapy   Manual Therapy  Neural Stretch    Neural Stretch  passive nerve glides L LE - poor tolerance             PT Education - 02/23/17 0938    Education provided  Yes    Education Details  Initial HEP    Person(s) Educated  Patient    Methods  Explanation;Demonstration;Verbal cues;Tactile cues;Handout    Comprehension  Verbalized understanding;Returned demonstration;Verbal  cues required;Tactile cues required;Need further instruction          PT Long Term Goals - 02/15/17 1017      PT LONG TERM GOAL #1   Title  Independent with initial HEP    Status  New    Target Date  03/08/17      PT LONG TERM GOAL #2   Title  Pt will demonstrate improved gross L UE & LE strength by 1/2 MMT grade on average for improved function    Status  New    Target Date  03/08/17      PT LONG TERM GOAL #3   Title  Pt will report 25% improvement in activity tolerance with decreased limitations due to L UE/LE pain and weakness    Status  New    Target Date  03/08/17            Plan - 02/23/17 0939    Clinical Impression Statement  Attempted to initiate therapeutic exercise program with basic muscle setting exercises and gentle ROM exercises, but pt demonstrating poor tolernace with all exercise efforts, with pt in tears at times and reporting increased pain to the point where she feels "like I'm being torn in two". Minimal muscle activation observed/palpated during exercise attempts, but HEP instructions provided with pt encouraged to attempt to perform small sets of exercises t/o the day as tolerated. Also  suggested pt try taking pain meds prior to next therapy session to see if this will allow her better tolerance for activity.    Rehab Potential  Fair    PT Treatment/Interventions  Patient/family education;ADLs/Self Care Home Management;Therapeutic exercise;Therapeutic activities;Functional mobility training;Gait training;Stair training;Neuromuscular re-education;Balance training;Electrical Stimulation;Moist Heat;Cryotherapy;Iontophoresis 4mg /ml Dexamethasone;Manual techniques;Passive range of motion;Scar mobilization;Dry needling;Taping    Consulted and Agree with Plan of Care  Patient       Patient will benefit from skilled therapeutic intervention in order to improve the following deficits and impairments:  Pain, Decreased range of motion, Decreased strength, Impaired sensation, Impaired UE functional use, Impaired perceived functional ability, Decreased mobility, Decreased activity tolerance, Difficulty walking, Abnormal gait, Decreased balance, Decreased coordination, Decreased endurance  Visit Diagnosis: Muscle weakness (generalized)  Pain in left arm  Pain in left leg  Difficulty in walking, not elsewhere classified  Other abnormalities of gait and mobility     Problem List Patient Active Problem List   Diagnosis Date Noted  . Nausea and vomiting in adult 07/29/2016  . Abdominal pain 07/29/2016  . Hyperbilirubinemia 07/29/2016  . Sinus bradycardia 07/29/2016  . Syncope 07/28/2016  . Chest pain 07/28/2016  . GERD (gastroesophageal reflux disease) 07/28/2016  . Abnormal liver function tests 07/28/2016    Marry GuanJoAnne M , PT, MPT 02/23/2017, 12:27 PM  [facility] [place] [address]  [address] [place], [place], [address] Phone: [phone]   Fax:  [phone]  Name: Desiree Martin MRN: [id number] Date of Birth: 03/17/1980

## 2017-03-02 ENCOUNTER — Ambulatory Visit: Payer: Medicaid Other | Admitting: Physical Therapy

## 2017-03-02 ENCOUNTER — Encounter: Payer: Self-pay | Admitting: Physical Therapy

## 2017-03-02 DIAGNOSIS — M6281 Muscle weakness (generalized): Secondary | ICD-10-CM | POA: Diagnosis not present

## 2017-03-02 DIAGNOSIS — M79602 Pain in left arm: Secondary | ICD-10-CM

## 2017-03-02 DIAGNOSIS — M79605 Pain in left leg: Secondary | ICD-10-CM

## 2017-03-02 DIAGNOSIS — R2689 Other abnormalities of gait and mobility: Secondary | ICD-10-CM

## 2017-03-02 DIAGNOSIS — R262 Difficulty in walking, not elsewhere classified: Secondary | ICD-10-CM

## 2017-03-02 NOTE — Therapy (Signed)
[facility] [address]  [address] [place], [place], [address] Phone: [phone]   Fax:  [phone]  Physical Therapy Treatment  Patient Details  Name: Hilary Milks MRN: [id number] Date of Birth: 02-10-1980 Referring Provider: Lorriane Shire. Maple [place], PA-C   Encounter Date: 03/02/2017  PT End of Session - 03/02/17 0859    Visit Number  3    Number of Visits  4    Date for PT Re-Evaluation  03/08/17    Authorization Type  Medicaid: 02/23/17 - 03/15/17    Authorization - Visit Number  2    Authorization - Number of Visits  3    PT Start Time  0859 pt arrived late    PT Stop Time  0945    PT Time Calculation (min)  46 min    Activity Tolerance  Patient limited by pain;Patient limited by fatigue    Behavior During Therapy  [facility] for tasks assessed/performed       Past Medical History:  Diagnosis Date  . ASD (atrial septal defect)   . GERD (gastroesophageal reflux disease)   . Stroke [facility])     Past Surgical History:  Procedure Laterality Date  . ASD REPAIR      There were no vitals filed for this visit.  Subjective Assessment - 03/02/17 0902    Subjective  Pt reporting she tried the initial HEP but had difficulty due to increased pain. Pt reporting her leg gave out causing her to fall this morning.    Patient Stated Goals  "to be able to control the pain so I can go get a job & to keep form getting weaker"    Currently in Pain?  Yes    Pain Score  10-Worst pain ever    Pain Location  Arm & leg    Pain Orientation  Left    Pain Descriptors / Indicators  Burning;Sharp;Numbness    Pain Type  Chronic pain    Pain Onset  More than a month ago    Pain Frequency  Constant                      [facility] Adult PT Treatment/Exercise - 03/02/17 0859      Knee/Hip Exercises: Stretches   Passive Hamstring Stretch  Left;20 seconds;3 reps    Passive Hamstring Stretch Limitations  sitting with slight fwd lean    Gastroc  Stretch  Left;20 seconds;3 reps    Gastroc Stretch Limitations  sitting with strap      Knee/Hip Exercises: Aerobic   Nustep  L1 x 3' - slightly better tolerance with R UE/LE driving motion of machine, but still with increased L sided pain      Knee/Hip Exercises: Seated   Clamshell with TheraBand  Yellow alt hip ABD/ER 2x5    Other Seated Knee/Hip Exercises  heel/toe raises 2x5      Modalities   Modalities  Electrical Stimulation      Electrical Stimulation   Electrical Stimulation Location  L UE - CRPS pattern (anterior shoulder to lateral epicondyle & dorsal/ventral wrist)    Electrical Stimulation Action  Pre-mod    Electrical Stimulation Parameters  to pt tolerance x 8 min - stopped at pt request    Electrical Stimulation Goals  Pain;Edema                  PT Long Term Goals - 02/15/17 1017      PT  LONG TERM GOAL #1   Title  Independent with initial HEP    Status  New    Target Date  03/08/17      PT LONG TERM GOAL #2   Title  Pt will demonstrate improved gross L UE & LE strength by 1/2 MMT grade on average for improved function    Status  New    Target Date  03/08/17      PT LONG TERM GOAL #3   Title  Pt will report 25% improvement in activity tolerance with decreased limitations due to L UE/LE pain and weakness    Status  New    Target Date  03/08/17            Plan - 03/02/17 0906    Clinical Impression Statement  Pt continues to report baseline 10/10 pain in L UE and LE, worsening with all exercise and stretching attempts during therapy session as well as HEP attempts to the degree where pt reporting that she has to take OTC pain meds following exercise attempts. Initiated trial of estim using CPRS UE TENS protocol in attempt to reduce UE pain and associated edema, but pt requesting to stop treatment after 8 minutes due to poor tolerance.    Rehab Potential  Fair    PT Treatment/Interventions  Patient/family education;ADLs/Self Care Home  Management;Therapeutic exercise;Therapeutic activities;Functional mobility training;Gait training;Stair training;Neuromuscular re-education;Balance training;Electrical Stimulation;Moist Heat;Cryotherapy;Iontophoresis 4mg /ml Dexamethasone;Manual techniques;Passive range of motion;Scar mobilization;Dry needling;Taping    Consulted and Agree with Plan of Care  Patient       Patient will benefit from skilled therapeutic intervention in order to improve the following deficits and impairments:  Pain, Decreased range of motion, Decreased strength, Impaired sensation, Impaired UE functional use, Impaired perceived functional ability, Decreased mobility, Decreased activity tolerance, Difficulty walking, Abnormal gait, Decreased balance, Decreased coordination, Decreased endurance  Visit Diagnosis: Muscle weakness (generalized)  Pain in left arm  Pain in left leg  Difficulty in walking, not elsewhere classified  Other abnormalities of gait and mobility     Problem List Patient Active Problem List   Diagnosis Date Noted  . Nausea and vomiting in adult 07/29/2016  . Abdominal pain 07/29/2016  . Hyperbilirubinemia 07/29/2016  . Sinus bradycardia 07/29/2016  . Syncope 07/28/2016  . Chest pain 07/28/2016  . GERD (gastroesophageal reflux disease) 07/28/2016  . Abnormal liver function tests 07/28/2016    Marry GuanJoAnne M Kreis, PT, MPT 03/02/2017, 10:50 AM  [facility] [place] [address]  [address] [place], [place], [address] Phone: [phone]   Fax:  [phone]  Name: Silverio Decampriola Borghi MRN: [id number] Date of Birth: 08/26/1980

## 2017-03-08 ENCOUNTER — Ambulatory Visit: Payer: Medicaid Other | Admitting: Physical Therapy

## 2017-03-15 ENCOUNTER — Ambulatory Visit: Payer: Medicaid Other | Admitting: Physical Therapy

## 2017-03-20 ENCOUNTER — Ambulatory Visit: Payer: Medicaid Other | Admitting: Physical Therapy

## 2017-04-04 ENCOUNTER — Encounter: Payer: Self-pay | Admitting: Physical Therapy

## 2017-04-04 ENCOUNTER — Ambulatory Visit: Payer: Medicaid Other | Attending: Physician Assistant | Admitting: Physical Therapy

## 2017-04-04 DIAGNOSIS — R262 Difficulty in walking, not elsewhere classified: Secondary | ICD-10-CM | POA: Diagnosis present

## 2017-04-04 DIAGNOSIS — M6281 Muscle weakness (generalized): Secondary | ICD-10-CM | POA: Insufficient documentation

## 2017-04-04 DIAGNOSIS — M79605 Pain in left leg: Secondary | ICD-10-CM | POA: Insufficient documentation

## 2017-04-04 DIAGNOSIS — R2689 Other abnormalities of gait and mobility: Secondary | ICD-10-CM | POA: Diagnosis present

## 2017-04-04 DIAGNOSIS — M79602 Pain in left arm: Secondary | ICD-10-CM | POA: Insufficient documentation

## 2017-04-04 NOTE — Therapy (Signed)
[facility] [address]  [address] [place], [place], [address] Phone: [phone]   Fax:  [phone]  Physical Therapy Treatment  Patient Details  Name: Desiree Martin MRN: [id number] Date of Birth: [birth year] Referring Provider: Lorriane Shire. Maple Hudson, PA-C   Encounter Date: 04/04/2017  PT End of Session - 04/04/17 1016    Visit Number  4    Number of Visits  15    Date for PT Re-Evaluation  03/08/17    Authorization Type  Medicaid: 03/26/2017-05/20/2017    Authorization - Visit Number  1    Authorization - Number of Visits  12    PT Start Time  1016    PT Stop Time  1055    PT Time Calculation (min)  39 min    Activity Tolerance  Patient limited by pain;Patient limited by fatigue    Behavior During Therapy  [facility] for tasks assessed/performed       Past Medical History:  Diagnosis Date  . ASD (atrial septal defect)   . GERD (gastroesophageal reflux disease)   . Stroke [facility])     Past Surgical History:  Procedure Laterality Date  . ASD REPAIR      There were no vitals filed for this visit.  Subjective Assessment - 04/04/17 1021    Subjective  Pt reports pain has been getting worse since last visit, feeling like "the pain is deep inside all the way down to the bones".    Patient Stated Goals  "to be able to control the pain so I can go get a job & to keep form getting weaker"    Currently in Pain?  Yes    Pain Score  10-Worst pain ever 10+/10    Pain Location  Arm & leg    Pain Orientation  Left    Pain Descriptors / Indicators  Burning;Sharp;Numbness    Pain Type  Chronic pain    Pain Onset  More than a month ago    Pain Frequency  Constant         [facility] PT Assessment - 04/04/17 1016      Assessment   Medical Diagnosis  L arm & leg pain & weakness    Referring Provider  Lauren E. Young, PA-C    Onset Date/Surgical Date  -- 2004    Hand Dominance  Right      Prior Function   Level of Independence  Independent  with basic ADLs;Independent with household mobility without device;Needs assistance with homemaking    Vocation  Unemployed    Leisure  taking care of 38 yr old dtr                  [facility] Adult PT Treatment/Exercise - 04/04/17 1016      Exercises   Exercises  Knee/Hip;Shoulder      Knee/Hip Exercises: Supine   Short Arc Quad Sets  Left;5 reps    Bridges  Both;5 reps 3" hold    Bridges Limitations  pelvic tilt + slight hip clearance    Other Supine Knee/Hip Exercises  L hip ABD/ER clam with yellow TB x5      Shoulder Exercises: Seated   Flexion  Left;AAROM;5 reps    Flexion Limitations  table slides    Abduction  Left;AAROM;5 reps    ABduction Weight (lbs)  scaption table slides                  PT Long  Term Goals - 04/04/17 1055      PT LONG TERM GOAL #1   Title  Independent with initial HEP    Status  On-going    Target Date  05/20/17      PT LONG TERM GOAL #2   Title  Pt will demonstrate improved gross L UE & LE strength by 1/2-1 MMT grade on average for improved function    Status  On-going    Target Date  05/20/17      PT LONG TERM GOAL #3   Title  Pt will report 25-50% improvement in activity tolerance with decreased limitations due to L UE/LE pain and weakness    Status  On-going    Target Date  05/20/17            Plan - 04/04/17 1025    Clinical Impression Statement  Desiree Martin reporting worsening pain over past few weeks and states this is typical of her pain flare-ups in that they linger over long periods of time. Pt stating pain limiting tolerance for completion of HEP, therefore discussed reducing frequency to 1x/day or QOD and breaking exercises up t/o the day as needed to improve tolerance for exercise. Despite high pain levels, pt remains motivated to work with PT to improve her overall strength and mobility tolerance, but requires limited reps, frequent rest breaks and changes of position in order to manage pain. Pt was able to  tolerate slight progression of prior exercises as well as introduction of AAROM for L shoulder, but requested to stop session a few minutes early due to pain. With additional Medicaid authorization for 12 visits, pt wishing to try increasing frequency to 2x/wk.    Rehab Potential  Fair    PT Frequency  2x / week 1-2x/wk    PT Duration  8 weeks 6-8 wks    PT Treatment/Interventions  Patient/family education;ADLs/Self Care Home Management;Therapeutic exercise;Therapeutic activities;Functional mobility training;Gait training;Stair training;Neuromuscular re-education;Balance training;Electrical Stimulation;Moist Heat;Cryotherapy;Iontophoresis 4mg /ml Dexamethasone;Manual techniques;Passive range of motion;Scar mobilization;Dry needling;Taping    Consulted and Agree with Plan of Care  Patient       Patient will benefit from skilled therapeutic intervention in order to improve the following deficits and impairments:  Pain, Decreased range of motion, Decreased strength, Impaired sensation, Impaired UE functional use, Impaired perceived functional ability, Decreased mobility, Decreased activity tolerance, Difficulty walking, Abnormal gait, Decreased balance, Decreased coordination, Decreased endurance  Visit Diagnosis: Muscle weakness (generalized)  Pain in left arm  Pain in left leg  Difficulty in walking, not elsewhere classified  Other abnormalities of gait and mobility     Problem List Patient Active Problem List   Diagnosis Date Noted  . Nausea and vomiting in adult 07/29/2016  . Abdominal pain 07/29/2016  . Hyperbilirubinemia 07/29/2016  . Sinus bradycardia 07/29/2016  . Syncope 07/28/2016  . Chest pain 07/28/2016  . GERD (gastroesophageal reflux disease) 07/28/2016  . Abnormal liver function tests 07/28/2016    Marry GuanJoAnne M Dana Dorner, PT, MPT 04/04/2017, 12:02 PM  [facility] [place] [address]  [address] [place], [place],  [address] Phone: [phone]   Fax:  [phone]  Name: Desiree Martin MRN: [id number] Date of Birth: 11/01/1980

## 2017-04-13 ENCOUNTER — Ambulatory Visit: Payer: Medicaid Other | Attending: Physician Assistant | Admitting: Physical Therapy

## 2017-04-13 ENCOUNTER — Encounter: Payer: Self-pay | Admitting: Physical Therapy

## 2017-04-13 DIAGNOSIS — M79602 Pain in left arm: Secondary | ICD-10-CM

## 2017-04-13 DIAGNOSIS — R2689 Other abnormalities of gait and mobility: Secondary | ICD-10-CM | POA: Diagnosis present

## 2017-04-13 DIAGNOSIS — M79605 Pain in left leg: Secondary | ICD-10-CM | POA: Diagnosis present

## 2017-04-13 DIAGNOSIS — M6281 Muscle weakness (generalized): Secondary | ICD-10-CM

## 2017-04-13 DIAGNOSIS — R262 Difficulty in walking, not elsewhere classified: Secondary | ICD-10-CM

## 2017-04-13 NOTE — Therapy (Signed)
[facility] [place] [address]  [address] [place], [place], [address] Phone: [phone]   Fax:  [phone]  Physical Therapy Treatment  Patient Details  Name: Desiree Martin MRN: [id number] Date of Birth: 08/19/1980 Referring Provider: Lorriane ShireLauren E. Rosanna RandyYoung, PA-C   Encounter Date: 04/13/2017  PT End of Session - 04/13/17 0804    Visit Number  5    Number of Visits  15    Date for PT Re-Evaluation  03/08/17    Authorization Type  Medicaid: 03/26/2017-05/20/2017    Authorization - Visit Number  2    Authorization - Number of Visits  12    PT Start Time  0804    PT Stop Time  0857    PT Time Calculation (min)  53 min    Activity Tolerance  Patient tolerated treatment well;Patient limited by fatigue;Patient limited by pain    Behavior During Therapy  [facility] for tasks assessed/performed       Past Medical History:  Diagnosis Date  . ASD (atrial septal defect)   . GERD (gastroesophageal reflux disease)   . Stroke [facility](HCC)     Past Surgical History:  Procedure Laterality Date  . ASD REPAIR      There were no vitals filed for this visit.  Subjective Assessment - 04/13/17 0807    Subjective  Pt reporting her pain is better than last week, still 8-9/10, but pt states that the decrease in pain "is a big thing". Reporting increased numbness in both the L UE & LE.    Patient Stated Goals  "to be able to control the pain so I can go get a job & to keep form getting weaker"    Currently in Pain?  Yes    Pain Score  -- 8-9/10    Pain Location  Arm & leg    Pain Orientation  Left    Pain Descriptors / Indicators  Burning;Sharp;Numbness    Pain Type  Chronic pain    Pain Onset  More than a month ago                      [facility] - 04/13/17 0804      Exercises   Exercises  Knee/Hip;Shoulder      Knee/Hip Exercises: Aerobic   Nustep  L2 x 4' - initially B UE/LE, but had to stop with R UE after ~2 min      Knee/Hip Exercises: Seated   Long Arc Quad  Left;5 reps;Strengthening    Long Arc Quad Limitations  looped yellow TB at ankles    Clamshell with TheraBand  Yellow alt hip ABD/ER 2x5    Other Seated Knee/Hip Exercises  L LE fitter leg press (1 blue) x10    Other Seated Knee/Hip Exercises  heel/toe raises x10    Hamstring Curl  Left;5 reps;Strengthening    Hamstring Limitations  looped yellow TB at ankles      Shoulder Exercises: Standing   Row  Both;5 reps 2 sets    Theraband Level (Shoulder Row)  Level 1 (Yellow)      Shoulder Exercises: Therapy Ball   Flexion  5 reps 2 sets    Flexion Limitations  seated green Pball roll-out             PT Education - 04/13/17 0855    Education provided  Yes    Education Details  HEP update - yellow TB resistance exercises  Person(s) Educated  Patient    Methods  Explanation;Demonstration;Handout    Comprehension  Verbalized understanding;Returned demonstration;Need further instruction          PT Long Term Goals - 04/04/17 1055      PT LONG TERM GOAL #1   Title  Independent with initial HEP    Status  On-going    Target Date  05/20/17      PT LONG TERM GOAL #2   Title  Pt will demonstrate improved gross L UE & LE strength by 1/2-1 MMT grade on average for improved function    Status  On-going    Target Date  05/20/17      PT LONG TERM GOAL #3   Title  Pt will report 25-50% improvement in activity tolerance with decreased limitations due to L UE/LE pain and weakness    Status  On-going    Target Date  05/20/17            Plan - 04/13/17 0815    Clinical Impression Statement  Pt reporting pain improved today although still 8-9/10, but pt bolstered by this. Pt continuing to require small sets with frequent rest breaks and varying UE & LE exercises to improve overall therapy tolerance, but able to add yellow TB resistance to several exercises. HEP updated to include some theraband resistance exercises.    Rehab  Potential  Fair    PT Frequency  2x / week 1-2x/wk    PT Duration  8 weeks 6-8 wks    PT Treatment/Interventions  Patient/family education;ADLs/Self Care Home Management;Therapeutic exercise;Therapeutic activities;Functional mobility training;Gait training;Stair training;Neuromuscular re-education;Balance training;Electrical Stimulation;Moist Heat;Cryotherapy;Iontophoresis 4mg /ml Dexamethasone;Manual techniques;Passive range of motion;Scar mobilization;Dry needling;Taping    Consulted and Agree with Plan of Care  Patient       Patient will benefit from skilled therapeutic intervention in order to improve the following deficits and impairments:  Pain, Decreased range of motion, Decreased strength, Impaired sensation, Impaired UE functional use, Impaired perceived functional ability, Decreased mobility, Decreased activity tolerance, Difficulty walking, Abnormal gait, Decreased balance, Decreased coordination, Decreased endurance  Visit Diagnosis: Muscle weakness (generalized)  Pain in left arm  Pain in left leg  Difficulty in walking, not elsewhere classified  Other abnormalities of gait and mobility     Problem List Patient Active Problem List   Diagnosis Date Noted  . Nausea and vomiting in adult 07/29/2016  . Abdominal pain 07/29/2016  . Hyperbilirubinemia 07/29/2016  . Sinus bradycardia 07/29/2016  . Syncope 07/28/2016  . Chest pain 07/28/2016  . GERD (gastroesophageal reflux disease) 07/28/2016  . Abnormal liver function tests 07/28/2016    Marry Guan, PT, MPT 04/13/2017, 9:15 AM  [facility] [address]  [address] [place], [place], [address] Phone: [phone]   Fax:  [phone]  Name: Desiree Martin MRN: [id number] Date of Birth: 1980-08-26

## 2017-04-17 ENCOUNTER — Encounter: Payer: Self-pay | Admitting: Physical Therapy

## 2017-04-17 ENCOUNTER — Ambulatory Visit: Payer: Medicaid Other | Admitting: Physical Therapy

## 2017-04-17 DIAGNOSIS — R262 Difficulty in walking, not elsewhere classified: Secondary | ICD-10-CM

## 2017-04-17 DIAGNOSIS — M79602 Pain in left arm: Secondary | ICD-10-CM

## 2017-04-17 DIAGNOSIS — M6281 Muscle weakness (generalized): Secondary | ICD-10-CM

## 2017-04-17 DIAGNOSIS — M79605 Pain in left leg: Secondary | ICD-10-CM

## 2017-04-17 DIAGNOSIS — R2689 Other abnormalities of gait and mobility: Secondary | ICD-10-CM

## 2017-04-17 NOTE — Therapy (Signed)
[facility] [place] [address]  [address] [place], [place], [address] Phone: [phone]   Fax:  [phone]  Physical Therapy Treatment  Patient Details  Name: Desiree Martin MRN: [id number] Date of Birth: 10/31/1980 Referring Provider: Lorriane ShireLauren E. Maple HudsonYoung, [place] JerseyPA-C   Encounter Date: 04/17/2017  PT End of Session - 04/17/17 0802    Visit Number  6    Number of Visits  15    Date for PT Re-Evaluation  03/08/17    Authorization Type  Medicaid: 03/26/2017-05/20/2017    Authorization - Visit Number  3    Authorization - Number of Visits  12    PT Start Time  0802    PT Stop Time  0844    PT Time Calculation (min)  42 min    Activity Tolerance  Patient tolerated treatment well;Patient limited by fatigue;Patient limited by pain    Behavior During Therapy  [facility] for tasks assessed/performed       Past Medical History:  Diagnosis Date  . ASD (atrial septal defect)   . GERD (gastroesophageal reflux disease)   . Stroke [facility](HCC)     Past Surgical History:  Procedure Laterality Date  . ASD REPAIR      There were no vitals filed for this visit.  Subjective Assessment - 04/17/17 0806    Subjective  Pt reporting feeling of L LE giving way at times. Reports no issues with recent HEP progression. States she has an appt on 04/23/17 with a pain management specialist.    Patient Stated Goals  "to be able to control the pain so I can go get a job & to keep form getting weaker"    Currently in Pain?  Yes    Pain Score  7     Pain Location  Arm L leg 8/10    Pain Orientation  Left    Pain Descriptors / Indicators  Burning;Sharp;Numbness    Pain Type  Chronic pain    Pain Frequency  Constant                      [facility] Adult PT Treatment/Exercise - 04/17/17 0802      Exercises   Exercises  Knee/Hip;Shoulder      Knee/Hip Exercises: Aerobic   Nustep  L2 x 3:30' - initially B UE/LE, but had to stop with R UE after ~2 min      Knee/Hip  Exercises: Standing   Heel Raises  Both;10 reps;3 seconds    Hip Flexion  Both;10 reps;Knee bent    Hip Abduction  Both;5 reps;Knee straight    Hip Extension  Both;5 reps;Knee straight    Forward Step Up  Left;10 reps;Step Height: 6";Hand Hold: 2    Functional Squat  5 reps;2 sets;3 seconds    Functional Squat Limitations  counter minisquats      Knee/Hip Exercises: Seated   Other Seated Knee/Hip Exercises  L LE fitter leg press (1 blue) x10      Shoulder Exercises: Standing   Flexion  AAROM;Left;5 reps    Flexion Limitations  wall slides    Extension  Both;5 reps;Theraband 2 sets    Theraband Level (Shoulder Extension)  Level 1 (Yellow)      Shoulder Exercises: Therapy Ball   Flexion  5 reps    Flexion Limitations  seated green Pball roll-out    ABduction  5 reps    ABduction Limitations  seated green Pball scaption roll-out  PT Long Term Goals - 04/04/17 1055      PT LONG TERM GOAL #1   Title  Independent with initial HEP    Status  On-going    Target Date  05/20/17      PT LONG TERM GOAL #2   Title  Pt will demonstrate improved gross L UE & LE strength by 1/2-1 MMT grade on average for improved function    Status  On-going    Target Date  05/20/17      PT LONG TERM GOAL #3   Title  Pt will report 25-50% improvement in activity tolerance with decreased limitations due to L UE/LE pain and weakness    Status  On-going    Target Date  05/20/17            Plan - 04/17/17 0809    Clinical Impression Statement  Pt continuing to tolerate very gradual progression of exercises with increased standing exercises today, although still requiring alternating UE & LE exercises as well as frequent rest breaks. Better tolerance for LE exercises than UE, with pt reporting increased parasthesias and swelling in L UE, esp hand, with exercise attempts.    Rehab Potential  Fair    PT Frequency  2x / week 1-2x/wk    PT Duration  8 weeks 6-8 wks    PT  Treatment/Interventions  Patient/family education;ADLs/Self Care Home Management;Therapeutic exercise;Therapeutic activities;Functional mobility training;Gait training;Stair training;Neuromuscular re-education;Balance training;Electrical Stimulation;Moist Heat;Cryotherapy;Iontophoresis 4mg /ml Dexamethasone;Manual techniques;Passive range of motion;Scar mobilization;Dry needling;Taping    Consulted and Agree with Plan of Care  Patient       Patient will benefit from skilled therapeutic intervention in order to improve the following deficits and impairments:  Pain, Decreased range of motion, Decreased strength, Impaired sensation, Impaired UE functional use, Impaired perceived functional ability, Decreased mobility, Decreased activity tolerance, Difficulty walking, Abnormal gait, Decreased balance, Decreased coordination, Decreased endurance  Visit Diagnosis: Muscle weakness (generalized)  Pain in left arm  Pain in left leg  Difficulty in walking, not elsewhere classified  Other abnormalities of gait and mobility     Problem List Patient Active Problem List   Diagnosis Date Noted  . Nausea and vomiting in adult 07/29/2016  . Abdominal pain 07/29/2016  . Hyperbilirubinemia 07/29/2016  . Sinus bradycardia 07/29/2016  . Syncope 07/28/2016  . Chest pain 07/28/2016  . GERD (gastroesophageal reflux disease) 07/28/2016  . Abnormal liver function tests 07/28/2016    Marry Guan, PT, MPT 04/17/2017, 8:45 AM  [facility] [address]  [address] [place], [place], [address] Phone: [phone]   Fax:  [phone]  Name: Desiree Martin MRN: [id number] Date of Birth: Oct 15, 1980

## 2017-04-20 ENCOUNTER — Encounter: Payer: Self-pay | Admitting: Physical Therapy

## 2017-04-20 ENCOUNTER — Ambulatory Visit: Payer: Medicaid Other | Admitting: Physical Therapy

## 2017-04-20 DIAGNOSIS — R262 Difficulty in walking, not elsewhere classified: Secondary | ICD-10-CM

## 2017-04-20 DIAGNOSIS — R2689 Other abnormalities of gait and mobility: Secondary | ICD-10-CM

## 2017-04-20 DIAGNOSIS — M79602 Pain in left arm: Secondary | ICD-10-CM

## 2017-04-20 DIAGNOSIS — M79605 Pain in left leg: Secondary | ICD-10-CM

## 2017-04-20 DIAGNOSIS — M6281 Muscle weakness (generalized): Secondary | ICD-10-CM | POA: Diagnosis not present

## 2017-04-20 NOTE — Therapy (Signed)
[facility] [address]  [address] [place], [place], [address] Phone: [phone]   Fax:  [phone]  Physical Therapy Treatment  Patient Details  Name: Desiree Martin MRN: [id number] Date of Birth: 1980/10/28 Referring Provider: Lorriane Shire. Maple Hudson, PA-C   Encounter Date: 04/20/2017  Desiree Martin End of Session - 04/20/17 0806    Visit Number  7    Number of Visits  15    Date for Desiree Martin Re-Evaluation  03/08/17    Authorization Type  Medicaid: 03/26/2017-05/20/2017    Authorization - Visit Number  4    Authorization - Number of Visits  12    Desiree Martin Start Time  0806    Desiree Martin Stop Time  0849    Desiree Martin Time Calculation (min)  43 min    Activity Tolerance  Patient tolerated treatment well;Patient limited by fatigue;Patient limited by pain    Behavior During Therapy  [facility] for tasks assessed/performed       Past Medical History:  Diagnosis Date  . ASD (atrial septal defect)   . GERD (gastroesophageal reflux disease)   . Stroke [facility])     Past Surgical History:  Procedure Laterality Date  . ASD REPAIR      There were no vitals filed for this visit.  Subjective Assessment - 04/20/17 0810    Subjective  Desiree Martin reports her L knee gave out laterally causing her to fall landing on her L side. MD wants her to have an x-ray for her knee on Monday. Also will be seeing pain management MD on Monday at 8:00.    Patient Stated Goals  "to be able to control the pain so I can go get a job & to keep form getting weaker"    Currently in Pain?  Yes    Pain Score  10-Worst pain ever    Pain Location  Arm arm & knee    Pain Orientation  Left    Pain Descriptors / Indicators  Burning;Sharp;Numbness    Pain Type  Chronic pain    Pain Frequency  Constant                      [facility] Adult Desiree Martin Treatment/Exercise - 04/20/17 0806      Exercises   Exercises  Knee/Hip;Shoulder      Knee/Hip Exercises: Aerobic   Nustep  L2 x 5'      Knee/Hip Exercises: Seated    Long Arc Quad  Left;5 reps    Long Arc Quad Weight  1 lbs.    Long Arc Quad Limitations  + hip adduction ball squeeze    Marching  Both;Weights;Strengthening 8 reps    Marching Weights  1 lbs. on L, 2# on R    Abduction/Adduction   Left;10 reps    Abd/Adduction Limitations  adduction with yellow TB at ankle                  Desiree Martin Long Term Goals - 04/04/17 1055      Desiree Martin LONG TERM GOAL #1   Title  Independent with initial HEP    Status  On-going    Target Date  05/20/17      Desiree Martin LONG TERM GOAL #2   Title  Desiree Martin will demonstrate improved gross L UE & LE strength by 1/2-1 MMT grade on average for improved function    Status  On-going    Target Date  05/20/17  Desiree Martin LONG TERM GOAL #3   Title  Desiree Martin will report 25-50% improvement in activity tolerance with decreased limitations due to L UE/LE pain and weakness    Status  On-going    Target Date  05/20/17            Plan - 04/20/17 0827    Clinical Impression Statement  Desiree Martin reporting increased pain today from new trauma to L side due to a fall resulting from her L knee giving way. Desiree Martin with increased joint line and peripatellar tenderness in L knee and notes "painful cracking & popping" in L knee with movement - MD wanting her have x-ray of L knee on Monday and will also be meeting with the pain management specialist  (Manual MeierPhalan Bolden, FNP with [facility]) on monday as well. Therapy tolerance limited today due to increased pain, with Desiree Martin requiring fewer reps and more rest breaks as well as requesting to avoid UE exercises today.    Rehab Potential  Fair    Desiree Martin Frequency  -- 1-2x/wk    Desiree Martin Duration  -- 6-8 wks    Desiree Martin Treatment/Interventions  Patient/family education;ADLs/Self Care Home Management;Therapeutic exercise;Therapeutic activities;Functional mobility training;Gait training;Stair training;Neuromuscular re-education;Balance training;Electrical Stimulation;Moist Heat;Cryotherapy;Iontophoresis 4mg /ml Dexamethasone;Manual  techniques;Passive range of motion;Scar mobilization;Dry needling;Taping    Consulted and Agree with Plan of Care  Patient       Patient will benefit from skilled therapeutic intervention in order to improve the following deficits and impairments:  Pain, Decreased range of motion, Decreased strength, Impaired sensation, Impaired UE functional use, Impaired perceived functional ability, Decreased mobility, Decreased activity tolerance, Difficulty walking, Abnormal gait, Decreased balance, Decreased coordination, Decreased endurance  Visit Diagnosis: Muscle weakness (generalized)  Pain in left arm  Pain in left leg  Difficulty in walking, not elsewhere classified  Other abnormalities of gait and mobility     Problem List Patient Active Problem List   Diagnosis Date Noted  . Nausea and vomiting in adult 07/29/2016  . Abdominal pain 07/29/2016  . Hyperbilirubinemia 07/29/2016  . Sinus bradycardia 07/29/2016  . Syncope 07/28/2016  . Chest pain 07/28/2016  . GERD (gastroesophageal reflux disease) 07/28/2016  . Abnormal liver function tests 07/28/2016    Desiree Martin, Desiree Martin, Desiree Martin 04/20/2017, 8:57 AM  [facility] [place] [address]  [address] [place], [place], [address] Phone: [phone]   Fax:  [phone]  Name: Desiree Martin MRN: [id number] Date of Birth: 07/07/1980

## 2017-04-24 ENCOUNTER — Ambulatory Visit: Payer: Medicaid Other | Admitting: Physical Therapy

## 2017-04-24 ENCOUNTER — Encounter: Payer: Self-pay | Admitting: Physical Therapy

## 2017-04-24 DIAGNOSIS — M79602 Pain in left arm: Secondary | ICD-10-CM

## 2017-04-24 DIAGNOSIS — M79605 Pain in left leg: Secondary | ICD-10-CM

## 2017-04-24 DIAGNOSIS — M6281 Muscle weakness (generalized): Secondary | ICD-10-CM | POA: Diagnosis not present

## 2017-04-24 DIAGNOSIS — R2689 Other abnormalities of gait and mobility: Secondary | ICD-10-CM

## 2017-04-24 DIAGNOSIS — R262 Difficulty in walking, not elsewhere classified: Secondary | ICD-10-CM

## 2017-04-24 NOTE — Therapy (Signed)
[facility] [address]  [place], [place], [address] Phone: [phone]   Fax:  [phone]  Physical Therapy Treatment  Patient Details  Name: Desiree Martin MRN: [id number] Date of Birth: September 24, 1980 Referring Provider: Kyla Balzarine. Annamaria Boots, PA-C   Encounter Date: 04/24/2017  PT End of Session - 04/24/17 0800    Visit Number  8    Number of Visits  15    Date for PT Re-Evaluation  03/08/17    Authorization Type  Medicaid: 03/26/2017-05/20/2017    Authorization - Visit Number  4    Authorization - Number of Visits  12    PT Start Time  0801    PT Stop Time  0839    PT Time Calculation (min)  38 min    Activity Tolerance  Patient tolerated treatment well;Patient limited by fatigue;Patient limited by pain    Behavior During Therapy  [facility] for tasks assessed/performed tearful       Past Medical History:  Diagnosis Date  . ASD (atrial septal defect)   . GERD (gastroesophageal reflux disease)   . Stroke [facility])     Past Surgical History:  Procedure Laterality Date  . ASD REPAIR      There were no vitals filed for this visit.  Subjective Assessment - 04/24/17 0805    Subjective  Pt met with pain management specialist yesterday and he is starting her on gabapentin (she will pick prescription today).    Patient Stated Goals  "to be able to control the pain so I can go get a job & to keep form getting weaker"    Currently in Pain?  Yes    Pain Score  9     Pain Location  Arm & leg    Pain Orientation  Left    Pain Descriptors / Indicators  Burning;Sharp;Numbness    Pain Type  Chronic pain    Pain Frequency  Constant                      [facility] Adult PT Treatment/Exercise - 04/24/17 0801      Exercises   Exercises  Knee/Hip;Shoulder      Knee/Hip Exercises: Aerobic   Nustep  L2 x 4'      Knee/Hip Exercises: Standing   Terminal Knee Extension  Left;10 reps;Strengthening    Terminal Knee Extension  Limitations  small ball on wall    Hip Extension  Both;10 reps;Knee straight;Stengthening    Extension Limitations  looped yellow TB at ankles    Other Standing Knee Exercises  B side-stepping with tellow TB at ankles 2 x 24f      Knee/Hip Exercises: Seated   Clamshell with TheraBand  Yellow alt hip ABD/ER x10      Knee/Hip Exercises: Sidelying   Clams  L clam in R sidelying x10      Shoulder Exercises: ROM/Strengthening   Cybex Row  10 reps    Cybex Row Limitations  10#                  PT Long Term Goals - 04/04/17 1055      PT LONG TERM GOAL #1   Title  Independent with initial HEP    Status  On-going    Target Date  05/20/17      PT LONG TERM GOAL #2   Title  Pt will demonstrate improved gross L UE & LE strength by 1/2-1 MMT  grade on average for improved function    Status  On-going    Target Date  05/20/17      PT LONG TERM GOAL #3   Title  Pt will report 25-50% improvement in activity tolerance with decreased limitations due to L UE/LE pain and weakness    Status  On-going    Target Date  05/20/17            Plan - 04/24/17 0815    Clinical Impression Statement  Pt reporting pain in "entire L side" at one of worst levels today, with limited tolerance for exercises today and at one point was briefly in tears. Pt fearful that recent increased pain signals that she will be entering a several week/month epsiode of increased pain as has been her experience in the past.    Rehab Potential  Fair    PT Frequency  -- 1-2x/wk    PT Duration  -- 6-8 wks    PT Treatment/Interventions  Patient/family education;ADLs/Self Care Home Management;Therapeutic exercise;Therapeutic activities;Functional mobility training;Gait training;Stair training;Neuromuscular re-education;Balance training;Electrical Stimulation;Moist Heat;Cryotherapy;Iontophoresis 8m/ml Dexamethasone;Manual techniques;Passive range of motion;Scar mobilization;Dry needling;Taping    Consulted and Agree  with Plan of Care  Patient       Patient will benefit from skilled therapeutic intervention in order to improve the following deficits and impairments:  Pain, Decreased range of motion, Decreased strength, Impaired sensation, Impaired UE functional use, Impaired perceived functional ability, Decreased mobility, Decreased activity tolerance, Difficulty walking, Abnormal gait, Decreased balance, Decreased coordination, Decreased endurance  Visit Diagnosis: Muscle weakness (generalized)  Pain in left arm  Pain in left leg  Difficulty in walking, not elsewhere classified  Other abnormalities of gait and mobility     Problem List Patient Active Problem List   Diagnosis Date Noted  . Nausea and vomiting in adult 07/29/2016  . Abdominal pain 07/29/2016  . Hyperbilirubinemia 07/29/2016  . Sinus bradycardia 07/29/2016  . Syncope 07/28/2016  . Chest pain 07/28/2016  . GERD (gastroesophageal reflux disease) 07/28/2016  . Abnormal liver function tests 07/28/2016    JPercival Spanish PT, MPT 04/24/2017, 8:42 AM  [facility] [place] [address]Phone: [phone]  Fax:  [phone] Name: ELynzi MeulemansMRN: [id number] of Birth: 112-30-1982

## 2017-04-27 ENCOUNTER — Ambulatory Visit: Payer: Medicaid Other | Admitting: Physical Therapy

## 2017-04-27 DIAGNOSIS — M6281 Muscle weakness (generalized): Secondary | ICD-10-CM

## 2017-04-27 DIAGNOSIS — R262 Difficulty in walking, not elsewhere classified: Secondary | ICD-10-CM

## 2017-04-27 DIAGNOSIS — R2689 Other abnormalities of gait and mobility: Secondary | ICD-10-CM

## 2017-04-27 DIAGNOSIS — M79605 Pain in left leg: Secondary | ICD-10-CM

## 2017-04-27 DIAGNOSIS — M79602 Pain in left arm: Secondary | ICD-10-CM

## 2017-04-27 NOTE — Therapy (Signed)
[facility] [address]  [address] [place], [place], [address] Phone: [phone]   Fax:  [phone]  Physical Therapy Treatment  Patient Details  Name: Desiree Martin MRN: [id number] Date of Birth: 1980/12/22 Referring Provider: Lorriane Shire. Maple Hudson, PA-C   Encounter Date: 04/27/2017  PT End of Session - 04/27/17 0847    Visit Number  9    Number of Visits  15    Date for PT Re-Evaluation  03/08/17    Authorization Type  Medicaid: 03/26/2017-05/20/2017    Authorization - Visit Number  5    Authorization - Number of Visits  12    PT Start Time  0847    PT Stop Time  0920    PT Time Calculation (min)  33 min    Activity Tolerance  Patient tolerated treatment well;Patient limited by fatigue;Patient limited by pain    Behavior During Therapy  [facility] for tasks assessed/performed tearful       Past Medical History:  Diagnosis Date  . ASD (atrial septal defect)   . GERD (gastroesophageal reflux disease)   . Stroke [facility])     Past Surgical History:  Procedure Laterality Date  . ASD REPAIR      There were no vitals filed for this visit.  Subjective Assessment - 04/27/17 0852    Subjective  Pt reports pain has remained severe this week. Has started gabapentin, but too soon to note if it will help.    Patient Stated Goals  "to be able to control the pain so I can go get a job & to keep form getting weaker"    Currently in Pain?  Yes    Pain Score  10-Worst pain ever    Pain Location  Arm & leg    Pain Orientation  Left    Pain Descriptors / Indicators  Numbness;Pressure    Pain Type  Chronic pain    Pain Frequency  Constant                      [facility] Adult PT Treatment/Exercise - 04/27/17 0847      Knee/Hip Exercises: Aerobic   Nustep  L2 x 3'      Knee/Hip Exercises: Seated   Other Seated Knee/Hip Exercises  B heel/toe raises x10 1# on L, 2# on R    Marching  Both;10 reps;Weights    Marching Weights  1  lbs. on L, 2# on R      Knee/Hip Exercises: Supine   Bridges  Both;5 reps 3" hold    Bridges Limitations  pelvic tilt + slight hip clearance      Manual Therapy   Manual Therapy  Taping    Kinesiotex  Create Space      Kinesiotix   Create Space  L knee chondromalacia pattern                  PT Long Term Goals - 04/04/17 1055      PT LONG TERM GOAL #1   Title  Independent with initial HEP    Status  On-going    Target Date  05/20/17      PT LONG TERM GOAL #2   Title  Pt will demonstrate improved gross L UE & LE strength by 1/2-1 MMT grade on average for improved function    Status  On-going    Target Date  05/20/17      PT LONG TERM  GOAL #3   Title  Pt will report 25-50% improvement in activity tolerance with decreased limitations due to L UE/LE pain and weakness    Status  On-going    Target Date  05/20/17            Plan - 04/27/17 0907    Clinical Impression Statement  Desiree Martin reporting L sided pain has remained very severe since Monday this week with pain evidient in pt's demeanor and movement patterns. L knee pain even more pronounced according to pt, with feeling that knee is "shattering" - extreme ttp peripatellar and along joint line with slight lateral tracking of patella noted. Initiated trial of chondromalacia kinesiotaping pattern for L knee applied at very low level stretch and will assess response on next visit. Very minimal tolerance for exercises today with pt requesting to end session early due to pain.    Rehab Potential  Fair    PT Frequency  -- 1-2x/wk    PT Duration  -- 6-8 wks    PT Treatment/Interventions  Patient/family education;ADLs/Self Care Home Management;Therapeutic exercise;Therapeutic activities;Functional mobility training;Gait training;Stair training;Neuromuscular re-education;Balance training;Electrical Stimulation;Moist Heat;Cryotherapy;Iontophoresis 4mg /ml Dexamethasone;Manual techniques;Passive range of motion;Scar  mobilization;Dry needling;Taping    Consulted and Agree with Plan of Care  Patient       Patient will benefit from skilled therapeutic intervention in order to improve the following deficits and impairments:  Pain, Decreased range of motion, Decreased strength, Impaired sensation, Impaired UE functional use, Impaired perceived functional ability, Decreased mobility, Decreased activity tolerance, Difficulty walking, Abnormal gait, Decreased balance, Decreased coordination, Decreased endurance  Visit Diagnosis: Muscle weakness (generalized)  Pain in left arm  Pain in left leg  Difficulty in walking, not elsewhere classified  Other abnormalities of gait and mobility     Problem List Patient Active Problem List   Diagnosis Date Noted  . Nausea and vomiting in adult 07/29/2016  . Abdominal pain 07/29/2016  . Hyperbilirubinemia 07/29/2016  . Sinus bradycardia 07/29/2016  . Syncope 07/28/2016  . Chest pain 07/28/2016  . GERD (gastroesophageal reflux disease) 07/28/2016  . Abnormal liver function tests 07/28/2016    Desiree Martin, PT, MPT 04/27/2017, 9:29 AM  [facility] [place] [address]  [address] [place], [place], [address] Phone: [phone]   Fax:  [phone]  Name: Desiree Martin MRN: [id number] Date of Birth: 10/16/1980

## 2017-05-01 ENCOUNTER — Encounter: Payer: Self-pay | Admitting: Physical Therapy

## 2017-05-01 ENCOUNTER — Ambulatory Visit: Payer: Medicaid Other | Admitting: Physical Therapy

## 2017-05-01 DIAGNOSIS — M79602 Pain in left arm: Secondary | ICD-10-CM

## 2017-05-01 DIAGNOSIS — M6281 Muscle weakness (generalized): Secondary | ICD-10-CM | POA: Diagnosis not present

## 2017-05-01 DIAGNOSIS — R262 Difficulty in walking, not elsewhere classified: Secondary | ICD-10-CM

## 2017-05-01 DIAGNOSIS — R2689 Other abnormalities of gait and mobility: Secondary | ICD-10-CM

## 2017-05-01 DIAGNOSIS — M79605 Pain in left leg: Secondary | ICD-10-CM

## 2017-05-01 NOTE — Therapy (Signed)
[facility] [address]  [address] [place], [place], [address] Phone: [phone]   Fax:  [phone]  Physical Therapy Treatment  Patient Details  Name: Desiree Martin MRN: [id number] Date of Birth: Aug 28, 1980 Referring Provider: Lorriane Shire. Rosanna Randy   Encounter Date: 05/01/2017  PT End of Session - 05/01/17 0807    Visit Number  10    Number of Visits  15    Date for PT Re-Evaluation  03/08/17    Authorization Type  Medicaid: 03/26/2017-05/20/2017    Authorization - Visit Number  6    Authorization - Number of Visits  12    PT Start Time  0807 pt arrived late    PT Stop Time  0838 pt requesting to end session early    PT Time Calculation (min)  31 min    Activity Tolerance  Patient tolerated treatment well;Patient limited by fatigue;Patient limited by pain    Behavior During Therapy  [facility] for tasks assessed/performed tearful       Past Medical History:  Diagnosis Date  . ASD (atrial septal defect)   . GERD (gastroesophageal reflux disease)   . Stroke [facility])     Past Surgical History:  Procedure Laterality Date  . ASD REPAIR      There were no vitals filed for this visit.  Subjective Assessment - 05/01/17 0814    Subjective  Pt reporting pain remains very elevated and she has not slept for 2 nights.    Patient Stated Goals  "to be able to control the pain so I can go get a job & to keep form getting weaker"    Currently in Pain?  Yes    Pain Score  10-Worst pain ever    Pain Location  Arm & leg    Pain Orientation  Left    Pain Descriptors / Indicators  Numbness;Squeezing "horrible"    Pain Type  Chronic pain    Pain Frequency  Constant                No data recorded       [facility] Adult PT Treatment/Exercise - 05/01/17 0807      Exercises   Exercises  Knee/Hip;Shoulder      Elbow Exercises   Elbow Flexion  Left;10 reps;Strengthening;Bar weights/barbell;Seated    Bar Weights/Barbell (Elbow  Flexion)  1 lb    Elbow Extension  Left;5 reps;Strengthening;Bar weights/barbell;Seated    Bar Weights/Barbell (Elbow Extension)  1 lb      Knee/Hip Exercises: Standing   Hip Flexion  Left;5 reps;Right;Knee straight;Stengthening 8 reps    Hip Flexion Limitations  looped yellow TB at ankles, R UE support on back of chair - L knee buckled during stance on 8th rep of R hip flexion      Shoulder Exercises: Seated   Row  Left;5 reps;Weights;Strengthening    Row Weight (lbs)  1                  PT Long Term Goals - 04/04/17 1055      PT LONG TERM GOAL #1   Title  Independent with initial HEP    Status  On-going    Target Date  05/20/17      PT LONG TERM GOAL #2   Title  Pt will demonstrate improved gross L UE & LE strength by 1/2-1 MMT grade on average for improved function    Status  On-going    Target  Date  05/20/17      PT LONG TERM GOAL #3   Title  Pt will report 25-50% improvement in activity tolerance with decreased limitations due to L UE/LE pain and weakness    Status  On-going    Target Date  05/20/17            Plan - 05/01/17 0819    Clinical Impression Statement  Pt feels like she is noting some benefit from kinesiotaping trial to L knee last visit, but still experiencing 10/10 pain t/o L side. Pt deferring warm-up and requesting to end session early due to pain. Discussed option of taking a break from PT for 1-2 weeks to see if pain levels will settle down with new medication regimen, but pt wanting to keep going as she feels like her pain would worsen if she stopped exercising.    Rehab Potential  Fair    PT Frequency  -- 1-2x/wk    PT Duration  -- 6-8 wks    PT Treatment/Interventions  Patient/family education;ADLs/Self Care Home Management;Therapeutic exercise;Therapeutic activities;Functional mobility training;Gait training;Stair training;Neuromuscular re-education;Balance training;Electrical Stimulation;Moist Heat;Cryotherapy;Iontophoresis 4mg /ml  Dexamethasone;Manual techniques;Passive range of motion;Scar mobilization;Dry needling;Taping    Consulted and Agree with Plan of Care  Patient       Patient will benefit from skilled therapeutic intervention in order to improve the following deficits and impairments:  Pain, Decreased range of motion, Decreased strength, Impaired sensation, Impaired UE functional use, Impaired perceived functional ability, Decreased mobility, Decreased activity tolerance, Difficulty walking, Abnormal gait, Decreased balance, Decreased coordination, Decreased endurance  Visit Diagnosis: Muscle weakness (generalized)  Pain in left arm  Pain in left leg  Difficulty in walking, not elsewhere classified  Other abnormalities of gait and mobility     Problem List Patient Active Problem List   Diagnosis Date Noted  . Nausea and vomiting in adult 07/29/2016  . Abdominal pain 07/29/2016  . Hyperbilirubinemia 07/29/2016  . Sinus bradycardia 07/29/2016  . Syncope 07/28/2016  . Chest pain 07/28/2016  . GERD (gastroesophageal reflux disease) 07/28/2016  . Abnormal liver function tests 07/28/2016    Marry GuanJoAnne M Isabela Nardelli, PT, MPT 05/01/2017, 8:44 AM  [facility] [address]  [address] [place], [place], [address] Phone: [phone]   Fax:  [phone]  Name: Silverio Decampriola Liberman MRN: [id number] Date of Birth: 07/08/1980

## 2017-05-04 ENCOUNTER — Ambulatory Visit: Payer: Medicaid Other | Admitting: Physical Therapy

## 2017-05-04 DIAGNOSIS — R2689 Other abnormalities of gait and mobility: Secondary | ICD-10-CM

## 2017-05-04 DIAGNOSIS — M79602 Pain in left arm: Secondary | ICD-10-CM

## 2017-05-04 DIAGNOSIS — M79605 Pain in left leg: Secondary | ICD-10-CM

## 2017-05-04 DIAGNOSIS — M6281 Muscle weakness (generalized): Secondary | ICD-10-CM | POA: Diagnosis not present

## 2017-05-04 DIAGNOSIS — R262 Difficulty in walking, not elsewhere classified: Secondary | ICD-10-CM

## 2017-05-04 NOTE — Therapy (Signed)
[facility] [place] [address]  [address] [place], [place], [address] Phone: [phone]   Fax:  [phone]  Physical Therapy Treatment  Patient Details  Name: Desiree Martin MRN: [id number] Date of Birth: 04/05/1980 Referring Provider: Lorriane ShireLauren E. Maple HudsonYoung, PA-C   Encounter Date: 05/04/2017  PT End of Session - 05/04/17 0935    Visit Number  11    Number of Visits  15    Date for PT Re-Evaluation  03/08/17    Authorization Type  Medicaid: 03/26/2017-05/20/2017    Authorization - Visit Number  8    Authorization - Number of Visits  12    PT Start Time  0935    PT Stop Time  1015    PT Time Calculation (min)  40 min    Activity Tolerance  Patient tolerated treatment well;Patient limited by fatigue;Patient limited by pain    Behavior During Therapy  [facility] for tasks assessed/performed tearful       Past Medical History:  Diagnosis Date  . ASD (atrial septal defect)   . GERD (gastroesophageal reflux disease)   . Stroke [facility])     Past Surgical History:  Procedure Laterality Date  . ASD REPAIR      There were no vitals filed for this visit.  Subjective Assessment - 05/04/17 0936    Subjective  Pt reports a fall on the stairs yesterday when her L knee gave way - bruised on from ribs to thigh on L.    Patient Stated Goals  "to be able to control the pain so I can go get a job & to keep form getting weaker"    Currently in Pain?  Yes    Pain Score  10-Worst pain ever    Pain Location  Arm & leg    Pain Orientation  Left                No data recorded       [facility] Adult PT Treatment/Exercise - 05/04/17 0935      Exercises   Exercises  Knee/Hip      Knee/Hip Exercises: Standing   Heel Raises  Both;5 reps;3 seconds    Heel Raises Limitations  UE support on TM rai; one episode of L knee buckling - discontinued at pt request      Knee/Hip Exercises: Seated   Long Arc Quad  Left;10 reps;Strengthening    Long Arc  Quad Limitations  red TB + hip adduction ball squeeze    Clamshell with TheraBand  Red alt hip ABD/ER x10    Other Seated Knee/Hip Exercises  L LE fitter leg press (2 blue) x10    Marching  Left;10 reps;Strengthening    Marching Limitations  red TB    Hamstring Curl  Left;10 reps;Strengthening    Hamstring Limitations  red TB    Abduction/Adduction   Left;10 reps    Abd/Adduction Limitations  adduction with red TB at knee    Sit to Sand  5 reps;without UE support from Airex pad on mat table                  PT Long Term Goals - 04/04/17 1055      PT LONG TERM GOAL #1   Title  Independent with initial HEP    Status  On-going    Target Date  05/20/17      PT LONG TERM GOAL #2   Title  Pt will demonstrate improved  gross L UE & LE strength by 1/2-1 MMT grade on average for improved function    Status  On-going    Target Date  05/20/17      PT LONG TERM GOAL #3   Title  Pt will report 25-50% improvement in activity tolerance with decreased limitations due to L UE/LE pain and weakness    Status  On-going    Target Date  05/20/17            Plan - 05/04/17 1015    Clinical Impression Statement  Desiree Martin reporting she feel on the stairs yesterday due to her L knee buckling resulting in bruising to her ribs, torso and thigh on the L side (did not seek medical attention) - due to this, pt requesting to defer warm-up and focus only on LE during exercises today. Pt able to tolerate exercise progression to red TB resistance with most LE exercises today without having to reduce reps but did continue to require intermittent rest breaks. Pt able to complete 5x sit to stand from elevated surface w/o need for UE assist today but limited eccentric control noted on return to sitting. Pt experiencing episode of L knee buckling during attempt at standing heel raises toward end of visit. High pain levels and repeated trauma due to falls at home limiting overall therapy progression but pt  remains motivated to continue to work at improving strength and function.    Rehab Potential  Fair    PT Frequency  -- 1-2x/wk    PT Duration  -- 6-8 wks    PT Treatment/Interventions  Patient/family education;ADLs/Self Care Home Management;Therapeutic exercise;Therapeutic activities;Functional mobility training;Gait training;Stair training;Neuromuscular re-education;Balance training;Electrical Stimulation;Moist Heat;Cryotherapy;Iontophoresis 4mg /ml Dexamethasone;Manual techniques;Passive range of motion;Scar mobilization;Dry needling;Taping    Consulted and Agree with Plan of Care  Patient       Patient will benefit from skilled therapeutic intervention in order to improve the following deficits and impairments:  Pain, Decreased range of motion, Decreased strength, Impaired sensation, Impaired UE functional use, Impaired perceived functional ability, Decreased mobility, Decreased activity tolerance, Difficulty walking, Abnormal gait, Decreased balance, Decreased coordination, Decreased endurance  Visit Diagnosis: Muscle weakness (generalized)  Pain in left arm  Pain in left leg  Difficulty in walking, not elsewhere classified  Other abnormalities of gait and mobility     Problem List Patient Active Problem List   Diagnosis Date Noted  . Nausea and vomiting in adult 07/29/2016  . Abdominal pain 07/29/2016  . Hyperbilirubinemia 07/29/2016  . Sinus bradycardia 07/29/2016  . Syncope 07/28/2016  . Chest pain 07/28/2016  . GERD (gastroesophageal reflux disease) 07/28/2016  . Abnormal liver function tests 07/28/2016    Desiree Martin, PT, MPT 05/04/2017, 12:49 PM  [facility] [address]  [address] [place], [place], [address] Phone: [phone]   Fax:  [phone]  Name: Desiree Martin MRN: [id number] Date of Birth: May 24, 1980

## 2017-05-08 ENCOUNTER — Encounter: Payer: Self-pay | Admitting: Physical Therapy

## 2017-05-08 ENCOUNTER — Ambulatory Visit: Payer: Medicaid Other | Attending: Physician Assistant | Admitting: Physical Therapy

## 2017-05-08 DIAGNOSIS — R262 Difficulty in walking, not elsewhere classified: Secondary | ICD-10-CM | POA: Diagnosis present

## 2017-05-08 DIAGNOSIS — M79602 Pain in left arm: Secondary | ICD-10-CM | POA: Diagnosis present

## 2017-05-08 DIAGNOSIS — M6281 Muscle weakness (generalized): Secondary | ICD-10-CM | POA: Diagnosis present

## 2017-05-08 DIAGNOSIS — M79605 Pain in left leg: Secondary | ICD-10-CM | POA: Diagnosis present

## 2017-05-08 DIAGNOSIS — R2689 Other abnormalities of gait and mobility: Secondary | ICD-10-CM | POA: Diagnosis present

## 2017-05-08 NOTE — Therapy (Signed)
[facility] [place] [address]  [address] [place], [place], [address] Phone: [phone]   Fax:  [phone]  Physical Therapy Treatment  Patient Details  Name: Desiree Martin MRN: [id number] Date of Birth: 07/13/1980 Referring Provider: Lorriane ShireLauren E. Rosanna RandyYoung, PA-C   Encounter Date: 05/08/2017  PT End of Session - 05/08/17 0806    Visit Number  12    Number of Visits  15    Date for PT Re-Evaluation  03/08/17    Authorization Type  Medicaid: 03/26/2017-05/20/2017    Authorization - Visit Number  9    Authorization - Number of Visits  12    PT Start Time  0806    PT Stop Time  0847    PT Time Calculation (min)  41 min    Activity Tolerance  Patient tolerated treatment well;Patient limited by fatigue;Patient limited by pain    Behavior During Therapy  [facility] for tasks assessed/performed tearful       Past Medical History:  Diagnosis Date  . ASD (atrial septal defect)   . GERD (gastroesophageal reflux disease)   . Stroke [facility](HCC)     Past Surgical History:  Procedure Laterality Date  . ASD REPAIR      There were no vitals filed for this visit.  Subjective Assessment - 05/08/17 0810    Subjective  Pt reports pain remains bad despite taking 2 Tylenol & Gabapentin this morning.    Patient Stated Goals  "to be able to control the pain so I can go get a job & to keep form getting weaker"    Currently in Pain?  Yes    Pain Score  10-Worst pain ever    Pain Location  Arm & leg    Pain Orientation  Left    Pain Descriptors / Indicators  Numbness;Squeezing "painful pulsing"    Pain Type  Chronic pain    Pain Frequency  Constant                       [facility] Adult PT Treatment/Exercise - 05/08/17 0806      Exercises   Exercises  Knee/Hip;Shoulder      Knee/Hip Exercises: Aerobic   Nustep  L3 x 3' (B LE & R UE)      Knee/Hip Exercises: Standing   Terminal Knee Extension  Left;5 reps;Strengthening 5" hold    Terminal Knee  Extension Limitations  small ball on wall    Wall Squat  10 reps;3 seconds    Wall Squat Limitations  minisquat with hip adduction ball squeeze      Shoulder Exercises: Seated   Horizontal ABduction  Both;5 reps;Theraband;Strengthening    Theraband Level (Shoulder Horizontal ABduction)  Level 1 (Yellow)    External Rotation  Both;10 reps;Theraband;Strengthening    Theraband Level (Shoulder External Rotation)  Level 1 (Yellow)      Shoulder Exercises: Standing   Horizontal ABduction  Both;5 reps;Theraband;Strengthening    Theraband Level (Shoulder Horizontal ABduction)  Level 1 (Yellow)    Extension  Both;10 reps;Theraband;Strengthening    Theraband Level (Shoulder Extension)  Level 1 (Yellow)                  PT Long Term Goals - 04/04/17 1055      PT LONG TERM GOAL #1   Title  Independent with initial HEP    Status  On-going    Target Date  05/20/17      PT  LONG TERM GOAL #2   Title  Pt will demonstrate improved gross L UE & LE strength by 1/2-1 MMT grade on average for improved function    Status  On-going    Target Date  05/20/17      PT LONG TERM GOAL #3   Title  Pt will report 25-50% improvement in activity tolerance with decreased limitations due to L UE/LE pain and weakness    Status  On-going    Target Date  05/20/17            Plan - 05/08/17 0815    Clinical Impression Statement  Pain continues to limit therapy tolerance with pt only selectively able to complete exercises during therapy session. Pt requesting update to HEP to focus on trunk strengthening for improved sitting tolerance. Progressed scapular strengtheing with emphasis on abdominal bracing, but pt deferring supine lumbar/core stabilization exercises. LE exercises focusing on VMO activation to decrease epsiodes of knee buckling, but pt deferring further LE/knee exercises after first two exercises.    Rehab Potential  Fair    PT Frequency  -- 1-2x/wk    PT Duration  -- 6-8 wks    PT  Treatment/Interventions  Patient/family education;ADLs/Self Care Home Management;Therapeutic exercise;Therapeutic activities;Functional mobility training;Gait training;Stair training;Neuromuscular re-education;Balance training;Electrical Stimulation;Moist Heat;Cryotherapy;Iontophoresis 4mg /ml Dexamethasone;Manual techniques;Passive range of motion;Scar mobilization;Dry needling;Taping    Consulted and Agree with Plan of Care  Patient       Patient will benefit from skilled therapeutic intervention in order to improve the following deficits and impairments:  Pain, Decreased range of motion, Decreased strength, Impaired sensation, Impaired UE functional use, Impaired perceived functional ability, Decreased mobility, Decreased activity tolerance, Difficulty walking, Abnormal gait, Decreased balance, Decreased coordination, Decreased endurance  Visit Diagnosis: Muscle weakness (generalized)  Pain in left arm  Pain in left leg  Difficulty in walking, not elsewhere classified  Other abnormalities of gait and mobility     Problem List Patient Active Problem List   Diagnosis Date Noted  . Nausea and vomiting in adult 07/29/2016  . Abdominal pain 07/29/2016  . Hyperbilirubinemia 07/29/2016  . Sinus bradycardia 07/29/2016  . Syncope 07/28/2016  . Chest pain 07/28/2016  . GERD (gastroesophageal reflux disease) 07/28/2016  . Abnormal liver function tests 07/28/2016    Marry Guan, PT, MPT 05/08/2017, 9:00 AM  [facility] [address]  [address] [place], [place], [address] Phone: [phone]   Fax:  [phone]  Name: Desiree Martin MRN: [id number] Date of Birth: 1980-09-17

## 2017-05-11 ENCOUNTER — Encounter: Payer: Self-pay | Admitting: Physical Therapy

## 2017-05-11 ENCOUNTER — Ambulatory Visit: Payer: Medicaid Other | Admitting: Physical Therapy

## 2017-05-11 DIAGNOSIS — M6281 Muscle weakness (generalized): Secondary | ICD-10-CM | POA: Diagnosis not present

## 2017-05-11 DIAGNOSIS — M79605 Pain in left leg: Secondary | ICD-10-CM

## 2017-05-11 DIAGNOSIS — R2689 Other abnormalities of gait and mobility: Secondary | ICD-10-CM

## 2017-05-11 DIAGNOSIS — R262 Difficulty in walking, not elsewhere classified: Secondary | ICD-10-CM

## 2017-05-11 DIAGNOSIS — M79602 Pain in left arm: Secondary | ICD-10-CM

## 2017-05-11 NOTE — Therapy (Signed)
[facility] [place] [address]  [address] [place], [place], [address] Phone: [phone]   Fax:  [phone]  Physical Therapy Treatment  Patient Details  Name: Desiree Martin MRN: [id number] Date of Birth: 01/05/1981 Referring Provider: Lorriane ShireLauren E. Rosanna RandyYoung, PA-C   Encounter Date: 05/11/2017  PT End of Session - 05/11/17 0804    Visit Number  13    Number of Visits  15    Date for PT Re-Evaluation  05/18/17    Authorization Type  Medicaid: 03/26/2017-05/20/2017    Authorization - Visit Number  10    Authorization - Number of Visits  12    PT Start Time  0804    PT Stop Time  0845    PT Time Calculation (min)  41 min    Activity Tolerance  Patient tolerated treatment well;Patient limited by fatigue;Patient limited by pain    Behavior During Therapy  [facility] for tasks assessed/performed tearful       Past Medical History:  Diagnosis Date  . ASD (atrial septal defect)   . GERD (gastroesophageal reflux disease)   . Stroke [facility](HCC)     Past Surgical History:  Procedure Laterality Date  . ASD REPAIR      There were no vitals filed for this visit.  Subjective Assessment - 05/11/17 0807    Patient Stated Goals  "to be able to control the pain so I can go get a job & to keep form getting weaker"    Currently in Pain?  Yes    Pain Score  10-Worst pain ever    Pain Location  Arm & leg    Pain Orientation  Left    Pain Descriptors / Indicators  Squeezing;Numbness;Pins and needles    Pain Type  Chronic pain    Pain Frequency  Constant                       [facility] Adult PT Treatment/Exercise - 05/11/17 0804      Exercises   Exercises  Knee/Hip;Shoulder;Elbow      Elbow Exercises   Elbow Flexion  Left;10 reps;Strengthening;Bar weights/barbell;Seated    Bar Weights/Barbell (Elbow Flexion)  1 lb    Elbow Extension  Left;10 reps;Strengthening    Bar Weights/Barbell (Elbow Extension)  1 lb      Knee/Hip Exercises: Aerobic    Nustep  L3 x 3:30' (B LE & R UE)      Knee/Hip Exercises: Standing   Hip Flexion  Both;10 reps;Knee straight;Stengthening    Hip Flexion Limitations  looped yellow TB at ankles; R UE support on back of chair    Hip Abduction  Both;10 reps;Knee straight;Stengthening    Abduction Limitations  looped yellow TB at ankles; R UE support on back of chair    Hip Extension  Both;10 reps;Knee straight;Stengthening    Extension Limitations  looped yellow TB at ankles; R UE support on back of chair    Lateral Step Up  Left;10 reps;Step Height: 6";Hand Hold: 2    Forward Step Up  Left;10 reps;Step Height: 6";Hand Hold: 2      Shoulder Exercises: Seated   Row  Both;10 reps;Theraband;Strengthening    Theraband Level (Shoulder Row)  Level 1 (Yellow)      Shoulder Exercises: Therapy Ball   Flexion  10 reps    Flexion Limitations  seated green Pball roll-out    ABduction  10 reps    ABduction Limitations  seated green  Pball roll-out                  PT Long Term Goals - 04/04/17 1055      PT LONG TERM GOAL #1   Title  Independent with initial HEP    Status  On-going    Target Date  05/20/17      PT LONG TERM GOAL #2   Title  Pt will demonstrate improved gross L UE & LE strength by 1/2-1 MMT grade on average for improved function    Status  On-going    Target Date  05/20/17      PT LONG TERM GOAL #3   Title  Pt will report 25-50% improvement in activity tolerance with decreased limitations due to L UE/LE pain and weakness    Status  On-going    Target Date  05/20/17            Plan - 05/11/17 0809    Clinical Impression Statement  Desiree Martin demonstrating better tolerance for therapuetic exercises today, noting that she took Tylenol ~1 hr before coming to PT. Able to complete 10 reps with all exercises with some fatigue noted requiring intermittent rest breaks, but no evidence of buckling or instability. Pt continues to require cueing for avoidance of substitution with movement  patterns on L.    Rehab Potential  Fair    PT Frequency  -- 1-2x/wk    PT Duration  -- 6-8 wks    PT Treatment/Interventions  Patient/family education;ADLs/Self Care Home Management;Therapeutic exercise;Therapeutic activities;Functional mobility training;Gait training;Stair training;Neuromuscular re-education;Balance training;Electrical Stimulation;Moist Heat;Cryotherapy;Iontophoresis 4mg /ml Dexamethasone;Manual techniques;Passive range of motion;Scar mobilization;Dry needling;Taping    Consulted and Agree with Plan of Care  Patient       Patient will benefit from skilled therapeutic intervention in order to improve the following deficits and impairments:  Pain, Decreased range of motion, Decreased strength, Impaired sensation, Impaired UE functional use, Impaired perceived functional ability, Decreased mobility, Decreased activity tolerance, Difficulty walking, Abnormal gait, Decreased balance, Decreased coordination, Decreased endurance  Visit Diagnosis: Muscle weakness (generalized)  Pain in left arm  Pain in left leg  Difficulty in walking, not elsewhere classified  Other abnormalities of gait and mobility     Problem List Patient Active Problem List   Diagnosis Date Noted  . Nausea and vomiting in adult 07/29/2016  . Abdominal pain 07/29/2016  . Hyperbilirubinemia 07/29/2016  . Sinus bradycardia 07/29/2016  . Syncope 07/28/2016  . Chest pain 07/28/2016  . GERD (gastroesophageal reflux disease) 07/28/2016  . Abnormal liver function tests 07/28/2016    Marry Guan, PT, MPT 05/11/2017, 9:07 AM  [facility] [address]  [address] [place], [place], [address] Phone: [phone]   Fax:  [phone]  Name: Desiree Martin MRN: [id number] Date of Birth: April 19, 1980

## 2017-05-15 ENCOUNTER — Encounter: Payer: Self-pay | Admitting: Physical Therapy

## 2017-05-15 ENCOUNTER — Ambulatory Visit: Payer: Medicaid Other | Admitting: Physical Therapy

## 2017-05-15 DIAGNOSIS — R262 Difficulty in walking, not elsewhere classified: Secondary | ICD-10-CM

## 2017-05-15 DIAGNOSIS — R2689 Other abnormalities of gait and mobility: Secondary | ICD-10-CM

## 2017-05-15 DIAGNOSIS — M79602 Pain in left arm: Secondary | ICD-10-CM

## 2017-05-15 DIAGNOSIS — M79605 Pain in left leg: Secondary | ICD-10-CM

## 2017-05-15 DIAGNOSIS — M6281 Muscle weakness (generalized): Secondary | ICD-10-CM

## 2017-05-15 NOTE — Therapy (Signed)
[facility] [place] [address]  [address] [place], [place], [address] Phone: [phone]   Fax:  [phone]  Physical Therapy Treatment  Patient Details  Name: Desiree Martin MRN: [id number] Date of Birth: 12/21/1980 Referring Provider: Lorriane ShireLauren E. Maple HudsonYoung, PA-C   Encounter Date: 05/15/2017  PT End of Session - 05/15/17 0810    Visit Number  14    Number of Visits  15    Date for PT Re-Evaluation  05/18/17    Authorization Type  Medicaid: 03/26/2017-05/20/2017    Authorization - Visit Number  11    Authorization - Number of Visits  12    PT Start Time  0810 pt arrived late    PT Stop Time  0848    PT Time Calculation (min)  38 min    Activity Tolerance  Patient tolerated treatment well;Patient limited by fatigue;Patient limited by pain    Behavior During Therapy  [facility] for tasks assessed/performed tearful       Past Medical History:  Diagnosis Date  . ASD (atrial septal defect)   . GERD (gastroesophageal reflux disease)   . Stroke [facility](HCC)     Past Surgical History:  Procedure Laterality Date  . ASD REPAIR      There were no vitals filed for this visit.  Subjective Assessment - 05/15/17 0811    Subjective  Pt stating she feels like her f/u with pain management on Friday went well - feels like the MD understands her. States he gave her a script for Oxycodone, but has not had it filled yet. Also wants her to increase her gabapentin but not sure how much, so she plans to call him for clarification.    Patient Stated Goals  "to be able to control the pain so I can go get a job & to keep form getting weaker"    Currently in Pain?  Yes    Pain Score  10-Worst pain ever 9-10/10 in L leg; 10/10 in L arm    Pain Location  Arm & leg    Pain Orientation  Left    Pain Descriptors / Indicators  Squeezing;Pins and needles;Numbness    Pain Frequency  Constant                       [facility] Adult PT Treatment/Exercise - 05/15/17  0810      Exercises   Exercises  Knee/Hip;Shoulder;Elbow      Knee/Hip Exercises: Aerobic   Nustep  L3 x 3:45' (B LE & R UE)      Shoulder Exercises: Seated   Elevation  Both;10 reps    Elevation Limitations  shoulder shrugs (lower arms supported on pillow in lap)    Flexion  Left;AAROM;10 reps    Flexion Limitations  table slides    Abduction  Left;AAROM;5 reps    ABduction Limitations  scaption table slides             PT Education - 05/15/17 0845    Education provided  Yes    Education Details  HEP update - shoulder AAROM flexion/scaption, elevation/circles    Person(s) Educated  Patient    Methods  Explanation;Demonstration;Handout    Comprehension  Verbalized understanding;Returned demonstration;Need further instruction          PT Long Term Goals - 04/04/17 1055      PT LONG TERM GOAL #1   Title  Independent with initial HEP    Status  On-going    Target Date  05/20/17      PT LONG TERM GOAL #2   Title  Pt will demonstrate improved gross L UE & LE strength by 1/2-1 MMT grade on average for improved function    Status  On-going    Target Date  05/20/17      PT LONG TERM GOAL #3   Title  Pt will report 25-50% improvement in activity tolerance with decreased limitations due to L UE/LE pain and weakness    Status  On-going    Target Date  05/20/17            Plan - 05/15/17 0818    Clinical Impression Statement  Pt reporting worsening pain UE > LE with decreasing functional use of L UE (difficulty dressing today as well as brushing her hair), with tendency to guard/splint her UE noted - instructed pt in UE exercises to prevent LOM in L shoulder to promote increased functional use of L UE. One visit remaining in current authorization period and given limited tolerance for PT due to pain at present, anticpate transition to HEP with option to resume PT at a later date if/when pain better controlled to allow for improved therapy tolerance.    Rehab  Potential  Fair    PT Frequency  -- 1-2x/wk    PT Duration  -- 6-8 wks    PT Treatment/Interventions  Patient/family education;ADLs/Self Care Home Management;Therapeutic exercise;Therapeutic activities;Functional mobility training;Gait training;Stair training;Neuromuscular re-education;Balance training;Electrical Stimulation;Moist Heat;Cryotherapy;Iontophoresis 4mg /ml Dexamethasone;Manual techniques;Passive range of motion;Scar mobilization;Dry needling;Taping    Consulted and Agree with Plan of Care  Patient       Patient will benefit from skilled therapeutic intervention in order to improve the following deficits and impairments:  Pain, Decreased range of motion, Decreased strength, Impaired sensation, Impaired UE functional use, Impaired perceived functional ability, Decreased mobility, Decreased activity tolerance, Difficulty walking, Abnormal gait, Decreased balance, Decreased coordination, Decreased endurance  Visit Diagnosis: Muscle weakness (generalized)  Pain in left arm  Pain in left leg  Difficulty in walking, not elsewhere classified  Other abnormalities of gait and mobility     Problem List Patient Active Problem List   Diagnosis Date Noted  . Nausea and vomiting in adult 07/29/2016  . Abdominal pain 07/29/2016  . Hyperbilirubinemia 07/29/2016  . Sinus bradycardia 07/29/2016  . Syncope 07/28/2016  . Chest pain 07/28/2016  . GERD (gastroesophageal reflux disease) 07/28/2016  . Abnormal liver function tests 07/28/2016    Marry Guan, PT, MPT 05/15/2017, 10:19 AM  [facility] [address]  [address] [place], [place], [address] Phone: [phone]   Fax:  [phone]  Name: Desiree Martin MRN: [id number] Date of Birth: 13-Apr-1980

## 2017-05-18 ENCOUNTER — Ambulatory Visit: Payer: Medicaid Other | Admitting: Physical Therapy

## 2017-05-18 ENCOUNTER — Encounter: Payer: Self-pay | Admitting: Physical Therapy

## 2017-05-18 DIAGNOSIS — M79602 Pain in left arm: Secondary | ICD-10-CM

## 2017-05-18 DIAGNOSIS — M79605 Pain in left leg: Secondary | ICD-10-CM

## 2017-05-18 DIAGNOSIS — M6281 Muscle weakness (generalized): Secondary | ICD-10-CM | POA: Diagnosis not present

## 2017-05-18 DIAGNOSIS — R2689 Other abnormalities of gait and mobility: Secondary | ICD-10-CM

## 2017-05-18 DIAGNOSIS — R262 Difficulty in walking, not elsewhere classified: Secondary | ICD-10-CM

## 2017-05-18 NOTE — Therapy (Signed)
[facility] [address]  [place], [place], [address] Phone: [phone]   Fax:  [phone]  Physical Therapy Treatment  Patient Details  Name: Desiree Martin MRN: [id number] Date of Birth: 12-24-1980 Referring Provider: Kyla Balzarine. Annamaria Boots, PA-C   Encounter Date: 05/18/2017  PT End of Session - 05/18/17 0806    Visit Number  15    Number of Visits  15    Date for PT Re-Evaluation  05/18/17    Authorization Type  Medicaid: 03/26/2017-05/20/2017    Authorization - Visit Number  12    Authorization - Number of Visits  12    PT Start Time  0806    PT Stop Time  0851    PT Time Calculation (min)  45 min    Activity Tolerance  Patient tolerated treatment well;Patient limited by fatigue;Patient limited by pain    Behavior During Therapy  [facility] for tasks assessed/performed tearful       Past Medical History:  Diagnosis Date  . ASD (atrial septal defect)   . GERD (gastroesophageal reflux disease)   . Stroke [facility])     Past Surgical History:  Procedure Laterality Date  . ASD REPAIR      There were no vitals filed for this visit.  Subjective Assessment - 05/18/17 0809    Subjective  States she has been trying to f/u on disability application and has been told she shoulde be eligible. Pt reports she had another fall this week due to her L knee giving way with more trauma to L side - asking PT to be careful with touching L UE/LE d/t pain from bruising..     Patient Stated Goals  "to be able to control the pain so I can go get a job & to keep form getting weaker"    Currently in Pain?  Yes    Pain Score  10-Worst pain ever    Pain Location  Arm & leg ("all of L side')         [facility] PT Assessment - 05/18/17 0806      Assessment   Medical Diagnosis  L arm & leg pain & weakness    Referring Provider  Lauren E. Young, PA-C    Onset Date/Surgical Date  -- 2004    Hand Dominance  Right      Precautions   Precautions   Fall      AROM   Left Shoulder Flexion  95 Degrees    Left Shoulder ABduction  79 Degrees    Left Knee Extension  30 "pain & cracking"      Strength   Overall Strength Comments  tested in sitting due pain with standard test positions    Right Shoulder Flexion  4+/5    Right Shoulder ABduction  4+/5    Right Shoulder Internal Rotation  4+/5    Right Shoulder External Rotation  4/5    Left Shoulder Flexion  2+/5    Left Shoulder ABduction  2/5    Left Shoulder Internal Rotation  2+/5    Left Shoulder External Rotation  2/5    Right Elbow Flexion  4+/5    Right Elbow Extension  4/5    Left Elbow Flexion  2+/5    Left Elbow Extension  2+/5    Right Forearm Pronation  4+/5    Right Forearm Supination  4+/5    Left Forearm Pronation  3/5    Left Forearm  Supination  3-/5    Right Hand Gross Grasp  Functional    Right Hand Grip (lbs)  41, 41, 36    Left Hand Gross Grasp  Impaired    Right Hip Flexion  4+/5    Right Hip Extension  4-/5    Right Hip ABduction  4+/5    Right Hip ADduction  4/5    Left Hip Flexion  3+/5    Left Hip Extension  2/5    Left Hip ABduction  2/5    Left Hip ADduction  2/5    Right Knee Flexion  4+/5    Right Knee Extension  4+/5    Left Knee Flexion  2+/5    Left Knee Extension  2/5    Right Ankle Dorsiflexion  4+/5    Right Ankle Plantar Flexion  4/5    Left Ankle Dorsiflexion  3-/5    Left Ankle Plantar Flexion  2-/5                   [facility] Adult PT Treatment/Exercise - 05/18/17 0806      Knee/Hip Exercises: Aerobic   Nustep  L3 x 4' (B LE & R UE)                  PT Long Term Goals - 05/18/17 0813      PT LONG TERM GOAL #1   Title  Independent with initial HEP    Status  Achieved      PT LONG TERM GOAL #2   Title  Pt will demonstrate improved gross L UE & LE strength by 1/2-1 MMT grade on average for improved function    Status  Partially Met      PT LONG TERM GOAL #3   Title  Pt will report 25-50% improvement  in activity tolerance with decreased limitations due to L UE/LE pain and weakness    Status  Not Met            Plan - 05/18/17 1038    Clinical Impression Statement  Desiree Martin reporting limited tolerance for HEP recently due to increasing overall pain, but feels that she has a good understanding of HEP and knows how to adjust reps and frequency to work w/in her pain tolerance. Assessment revealing some improvements in R shoulder and knee AROM but still lacking full ROM against gravity. Overall L UE/LE strength improved by grossly 1/2 grade but still severely limited functionally by chronic pain, worsening of late. Given recent worsening of pain and associated limited tolerance for therapeutic exercises and activities, will proceed with transition to HEP with discharge from PT at this time to allow pt time to work with pain management MD for better pain control and will consider further PT in the future as pain tolerance allows. While completing discharge assessment, pt c/o some dizziness and increased difficulty focusing on assessment tasks - upon further questioning, pt admitting to hitting her head during the fall on Wed nigt. Pt does not recall loss of consciousness, but admits to hazy recall of events surrounding fall. Concussion screening reveals positive headache, nausea/vomiting, balance problems, dizziness, sensitivity to light, feeling mentally foggy, difficulty concentrating/remembering, increased emotional liability (tearful), drowsiness, sleeping less than usual and trouble falling asleep. Given mutiple positive symptoms of possible concussion, strongly advised pt to go to ED for evaluation - pt refusing to go to ED within our building, but stated she would go to an ED closer to her home.  PT Treatment/Interventions  Patient/family education;ADLs/Self Care Home Management;Therapeutic exercise;Therapeutic activities;Functional mobility training;Gait training;Stair training;Neuromuscular  re-education;Balance training;Electrical Stimulation;Moist Heat;Cryotherapy;Iontophoresis 90m/ml Dexamethasone;Manual techniques;Passive range of motion;Scar mobilization;Dry needling;Taping    PT Next Visit Plan  Discharge    Consulted and Agree with Plan of Care  Patient       Patient will benefit from skilled therapeutic intervention in order to improve the following deficits and impairments:  Pain, Decreased range of motion, Decreased strength, Impaired sensation, Impaired UE functional use, Impaired perceived functional ability, Decreased mobility, Decreased activity tolerance, Difficulty walking, Abnormal gait, Decreased balance, Decreased coordination, Decreased endurance  Visit Diagnosis: Muscle weakness (generalized)  Pain in left arm  Pain in left leg  Difficulty in walking, not elsewhere classified  Other abnormalities of gait and mobility     Problem List Patient Active Problem List   Diagnosis Date Noted  . Nausea and vomiting in adult 07/29/2016  . Abdominal pain 07/29/2016  . Hyperbilirubinemia 07/29/2016  . Sinus bradycardia 07/29/2016  . Syncope 07/28/2016  . Chest pain 07/28/2016  . GERD (gastroesophageal reflux disease) 07/28/2016  . Abnormal liver function tests 07/28/2016    JPercival Spanish PT, MPT 05/18/2017, 11:45 AM  [facility] [place] [place] [address]Phone: [phone]  Fax:  [phone] Name: Desiree WandellMRN: [id number] of Birth: 105-03-1980

## 2017-11-05 IMAGING — CT CT ABD-PELV W/ CM
2 of 4 series · 16 of 46 positions shown, 18 images · IV contrast (ISOVUE)
Comparison: 07/28/2013 and examination performed earlier today at
[REDACTED].

CLINICAL DATA: Generalized abdominal and back pain for 3 days.
Nausea and vomiting. Syncopal episode yesterday with fall.

EXAM:
CT ABDOMEN AND PELVIS WITH CONTRAST
TECHNIQUE: Multidetector CT imaging of the abdomen and pelvis was performed
using the standard protocol following bolus administration of
intravenous contrast.
CONTRAST:  100 ml [id number].

[Series 2: abd/pel with · axial · 0.87mm/px · z∈[-425,+50]mm · 13 of 107 slices shown, 15 images]
[im 6/107  soft-tissue]
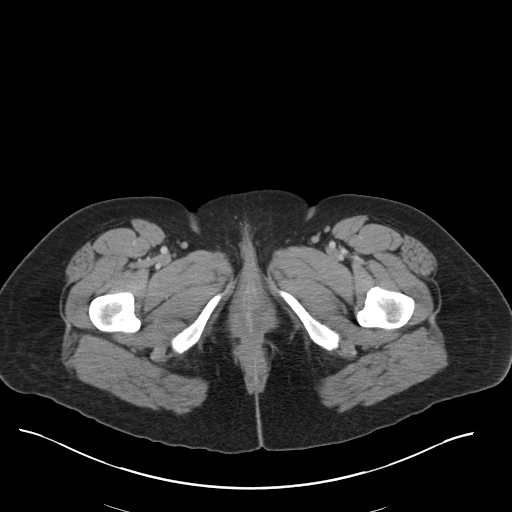
[im 6/107  bone]
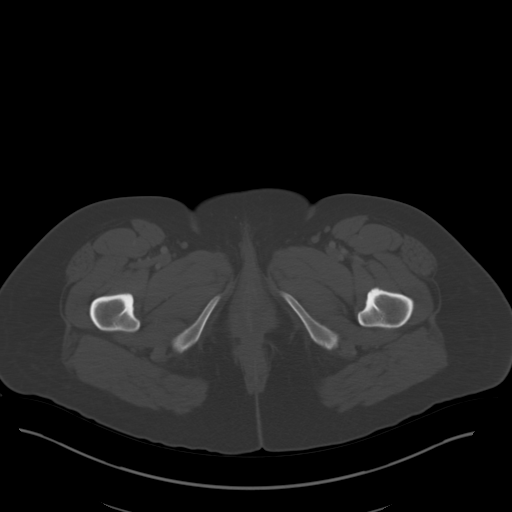
[im 16/107  soft-tissue]
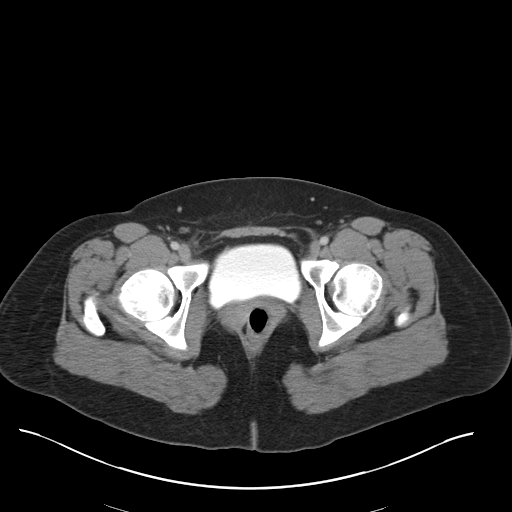
[im 22/107  soft-tissue]
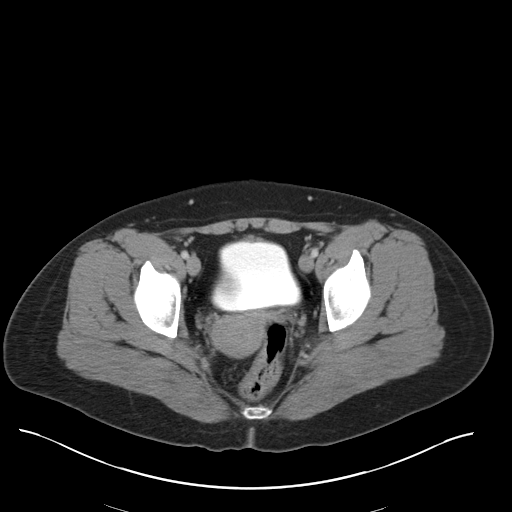
[im 32/107  soft-tissue]
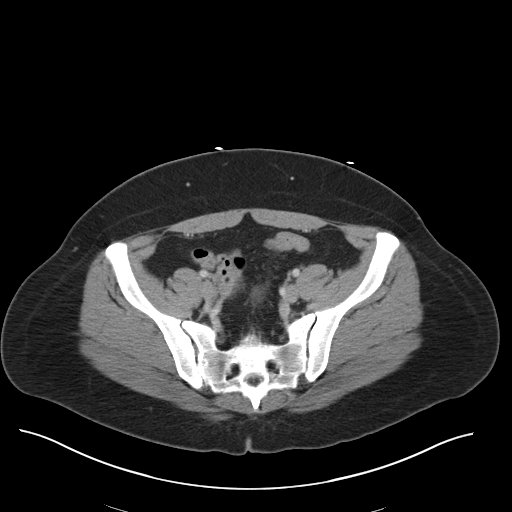
[im 38/107  soft-tissue]
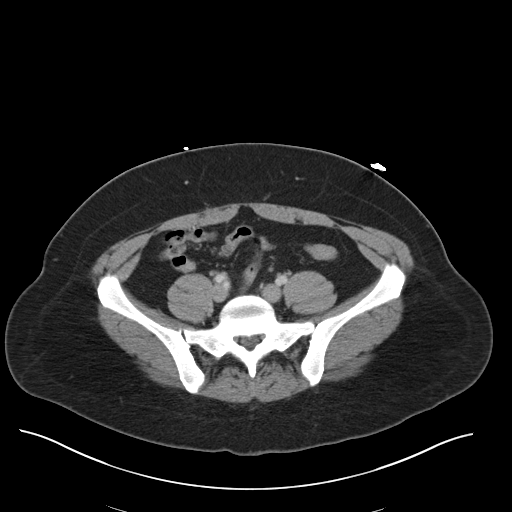
[im 48/107  soft-tissue]
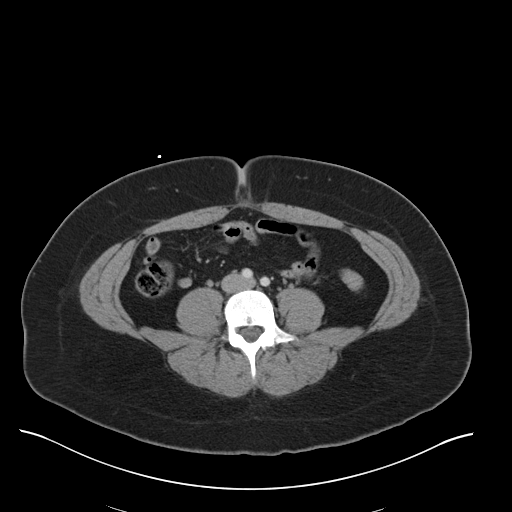
[im 54/107  soft-tissue]
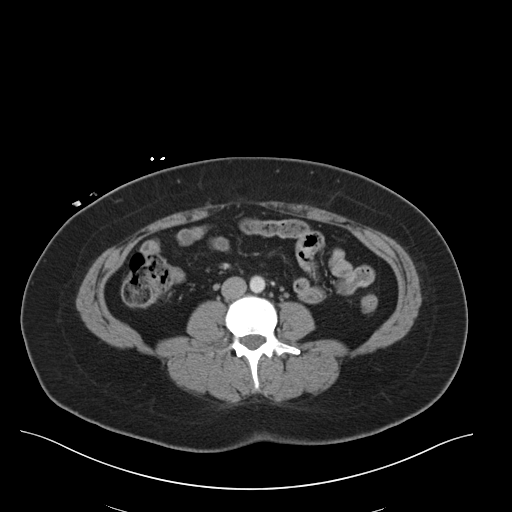
[im 59/107  soft-tissue]
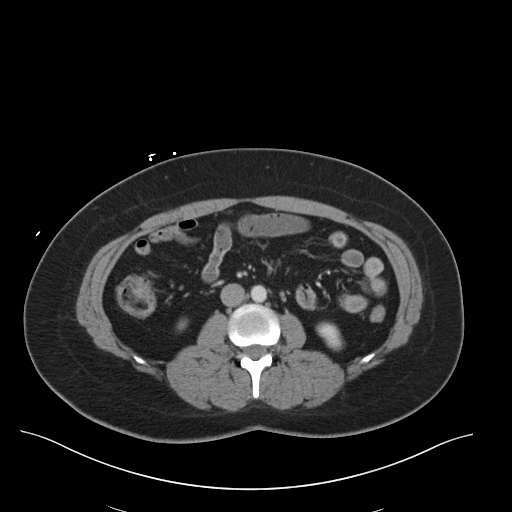
[im 69/107  soft-tissue]
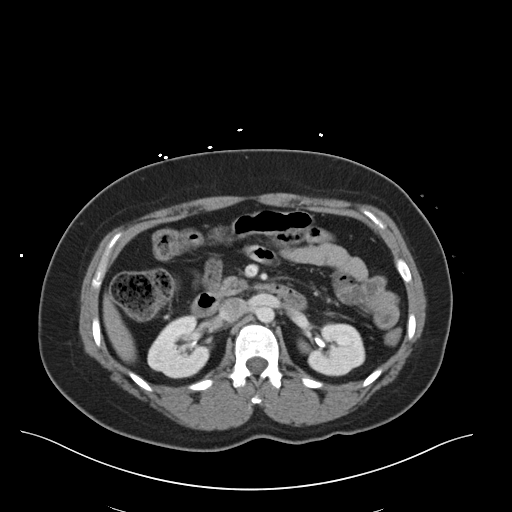
[im 69/107  bone]
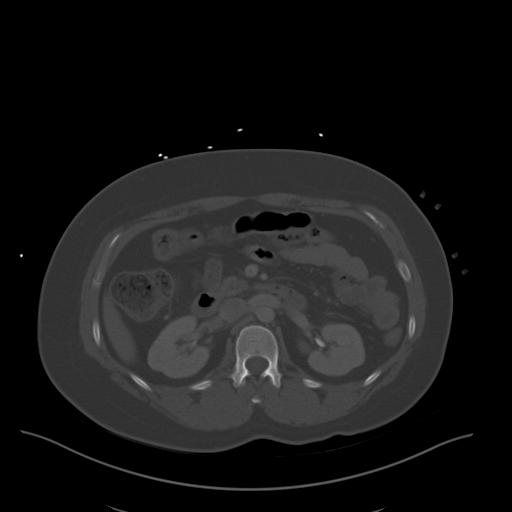
[im 75/107  soft-tissue]
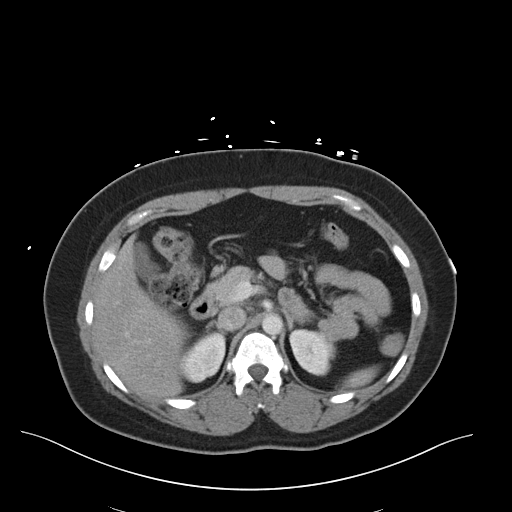
[im 85/107  soft-tissue]
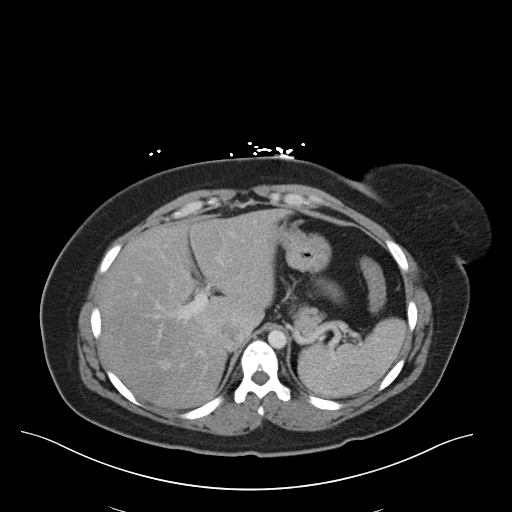
[im 91/107  soft-tissue]
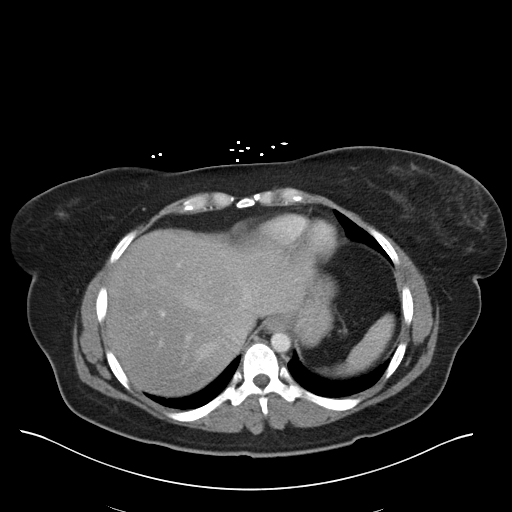
[im 101/107  soft-tissue]
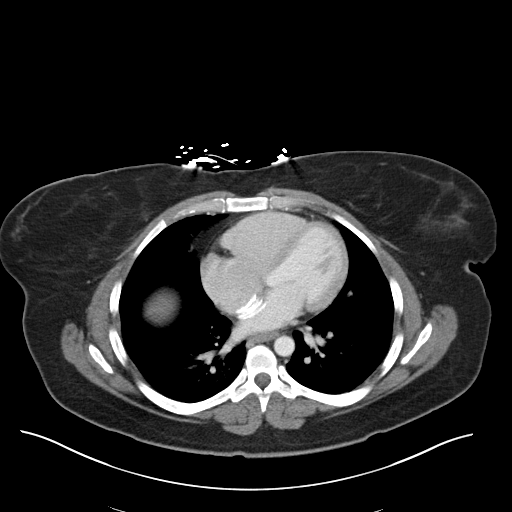

[Series 4: coronal a/|p · coronal · 0.79mm/px · 3 of 153 slices shown]
[im 51/153  soft-tissue]
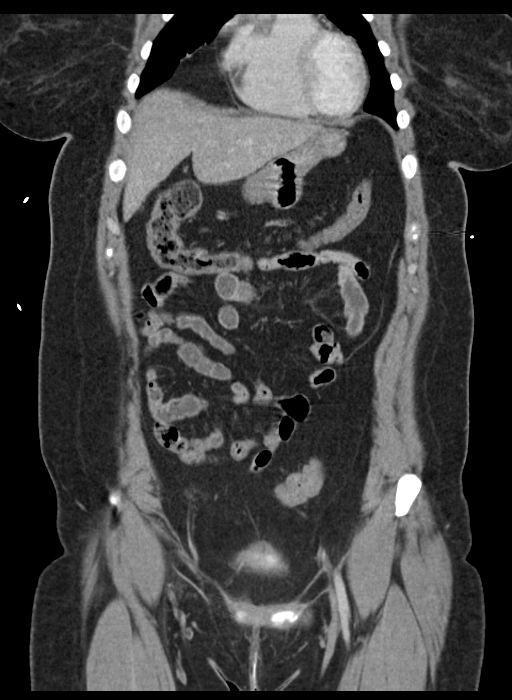
[im 68/153  soft-tissue]
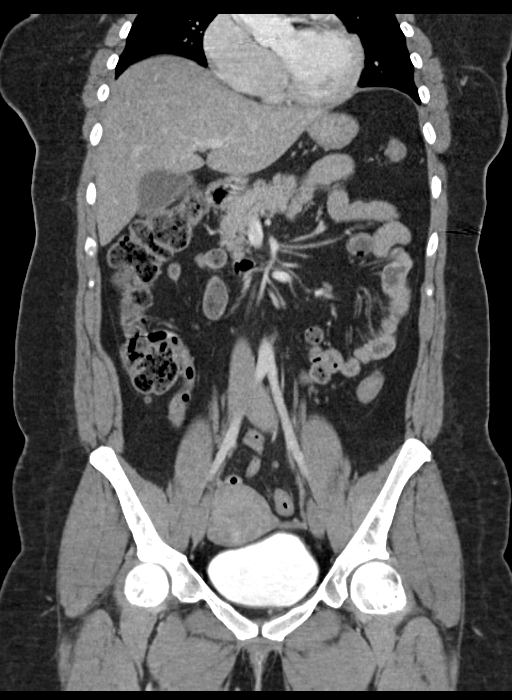
[im 85/153  soft-tissue]
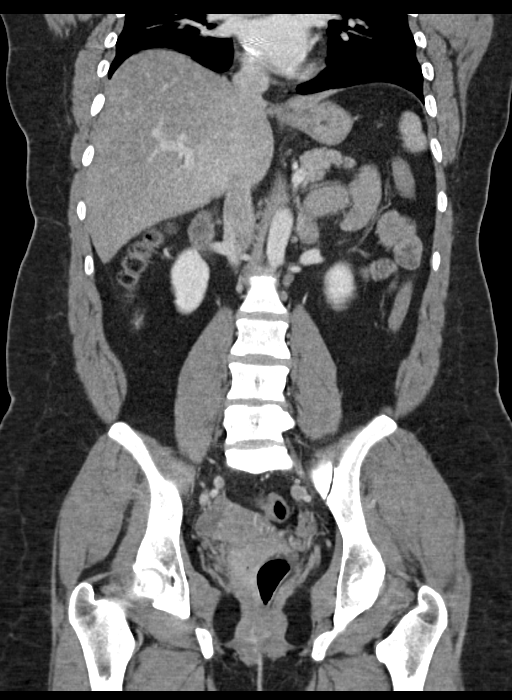

[16 of 46 positions shown; findings below may reference images not displayed]

FINDINGS: Lower chest: Clear lung bases. No significant pleural or pericardial
effusion. There is mild bibasilar atelectasis. ASD closure device
noted.

Hepatobiliary: Subjective mild steatosis. No focal lesion or
abnormal enhancement. No evidence of gallstones, gallbladder wall
thickening or biliary dilatation.

Pancreas: Unremarkable. No pancreatic ductal dilatation or
surrounding inflammatory changes.

Spleen: Normal in size without focal abnormality.

Adrenals/Urinary Tract: Both adrenal glands appear normal. The
kidneys appear normal without evidence of urinary tract calculus,
suspicious lesion or hydronephrosis. No bladder abnormalities are
seen. Contrast material is present in the bladder from the earlier
CT.

Stomach/Bowel: No evidence of bowel wall thickening, distention or
surrounding inflammatory change. The appendix appears normal.

Vascular/Lymphatic: There are no enlarged abdominal or pelvic lymph
nodes. No significant vascular findings are present.

Reproductive: The uterus and ovaries appear unremarkable.

Other: Stable tiny umbilical hernia containing only fat. No ascites.

Musculoskeletal: No acute or significant osseous findings.
IMPRESSION: No acute findings or explanation for the patient's symptoms.

## 2017-11-05 IMAGING — CT CT HEAD W/O CM
3 of 4 series · 14 of 47 positions shown, 16 images · non-contrast
Comparison: None.

CLINICAL DATA: Syncope. Nausea and vomiting. Fall 1 day prior with
headache

EXAM:
CT HEAD WITHOUT CONTRAST
TECHNIQUE: Contiguous axial images were obtained from the base of the skull
through the vertex without intravenous contrast.

[Series 2: head w/o · axial · non-contrast · 0.45mm/px · z∈[-39,+81]mm · 8 of 31 slices shown, 10 images]
[im 4/31  brain]
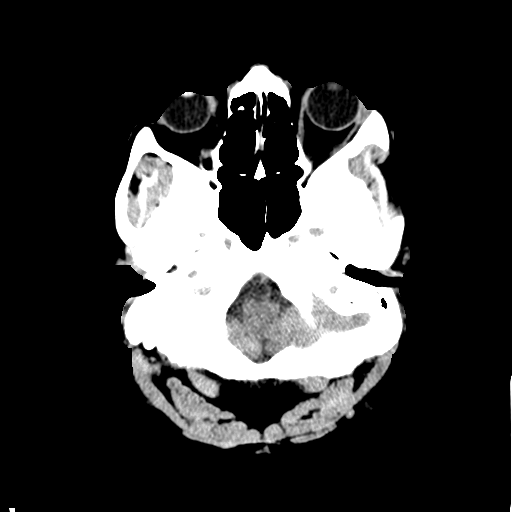
[im 4/31  bone]
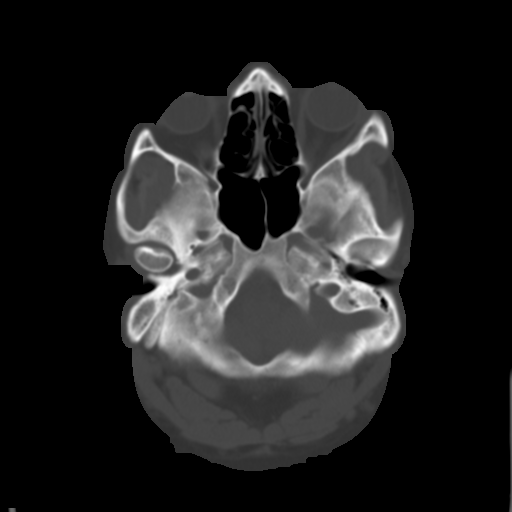
[im 7/31  brain]
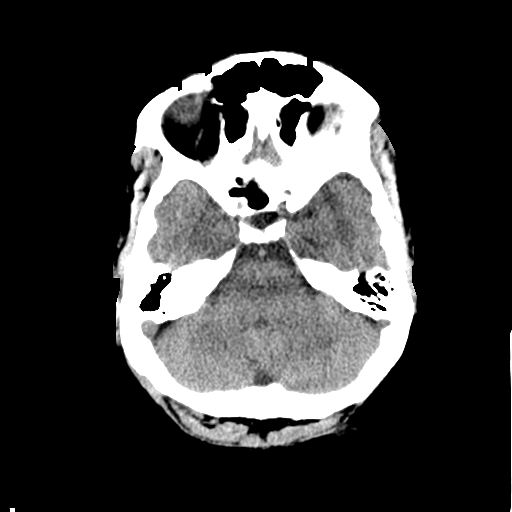
[im 10/31  brain]
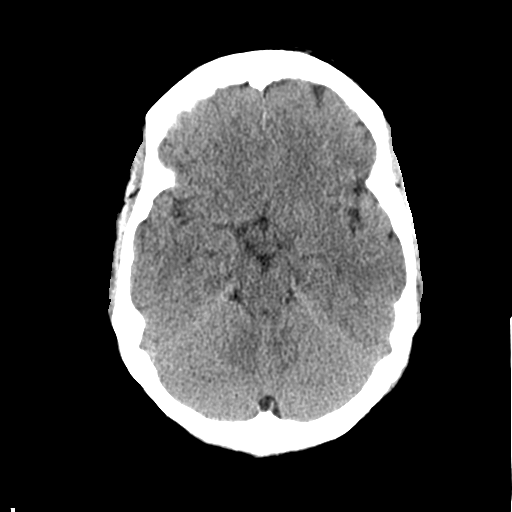
[im 13/31  brain]
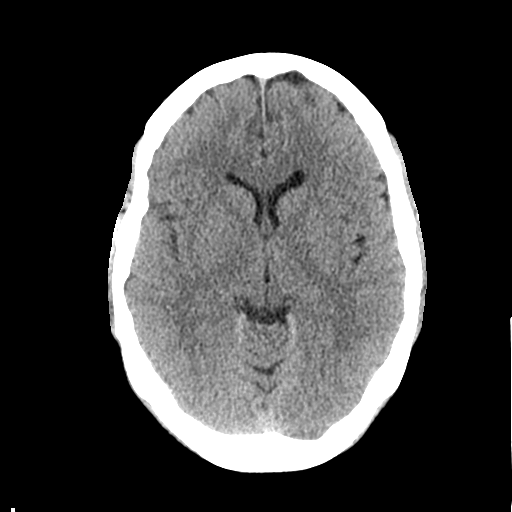
[im 19/31  brain]
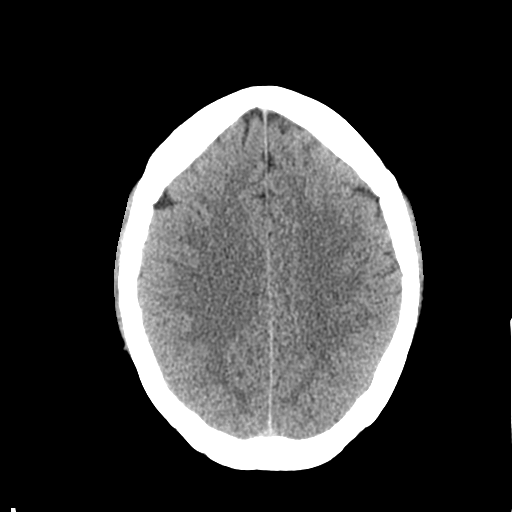
[im 19/31  bone]
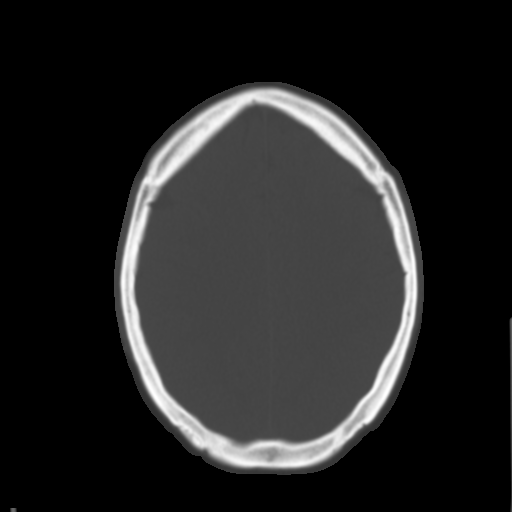
[im 22/31  brain]
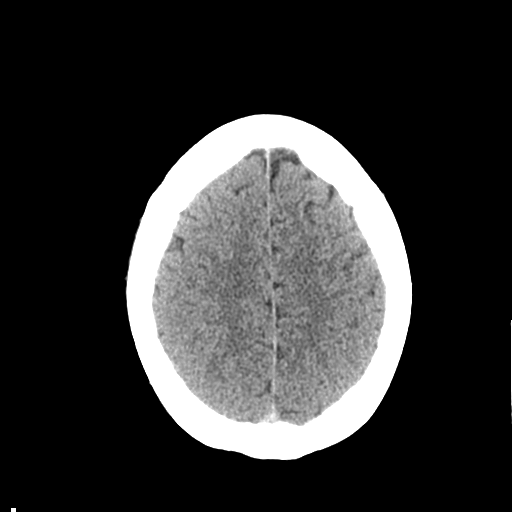
[im 25/31  brain]
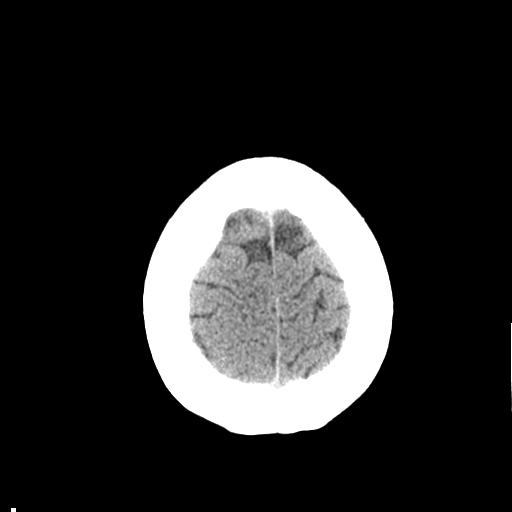
[im 28/31  brain]
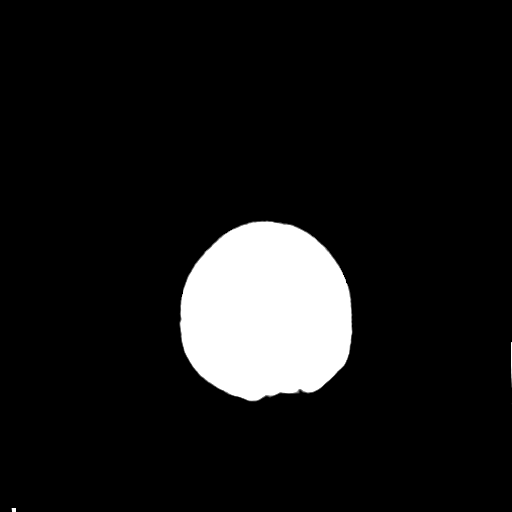

[Series 4: coronal · coronal · 0.30mm/px · 3 of 77 slices shown]
[im 26/77  brain]
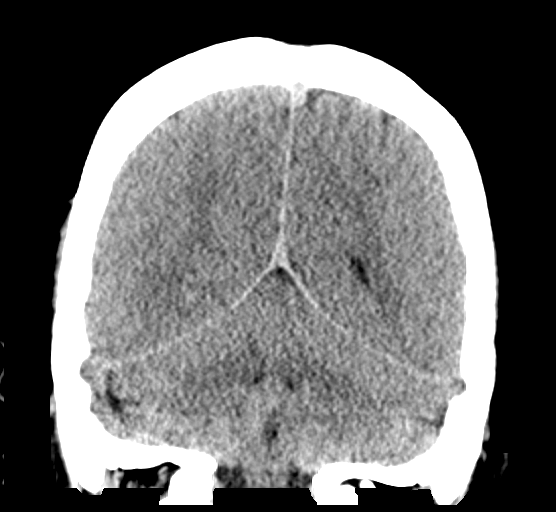
[im 34/77  brain]
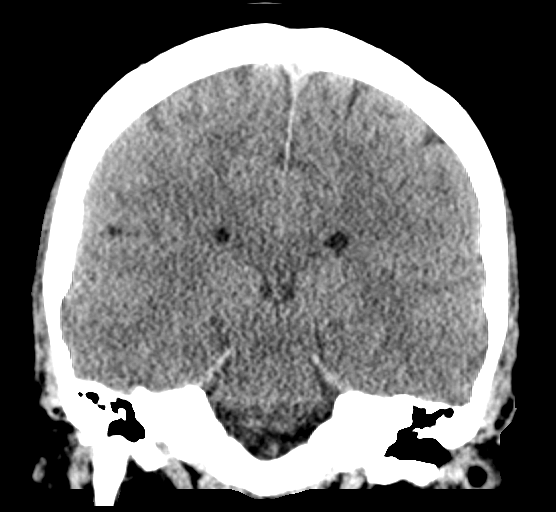
[im 43/77  brain]
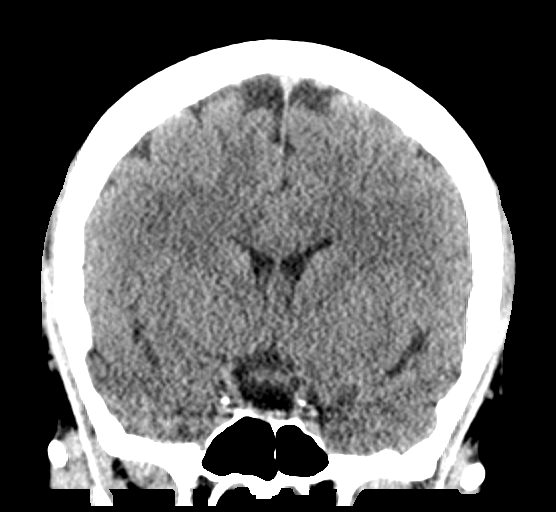

[Series 5: sagittal · sagittal · 0.30mm/px · 3 of 59 slices shown]
[im 20/59  brain]
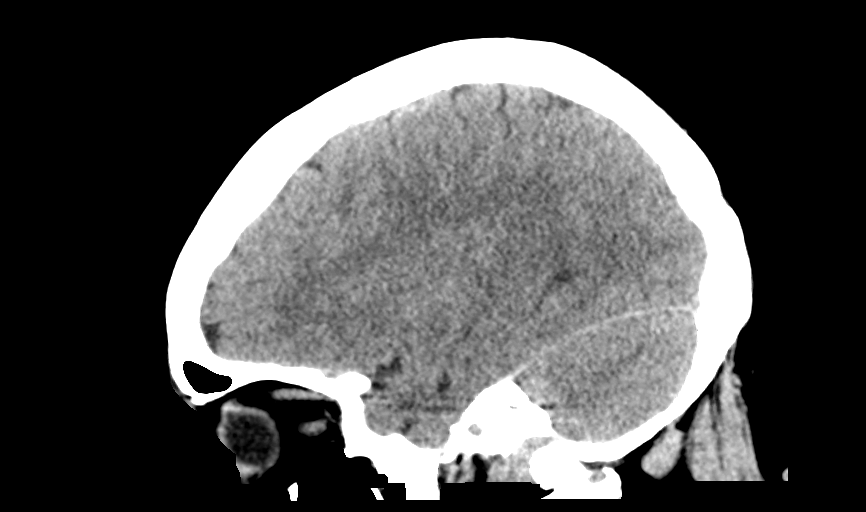
[im 30/59  brain]
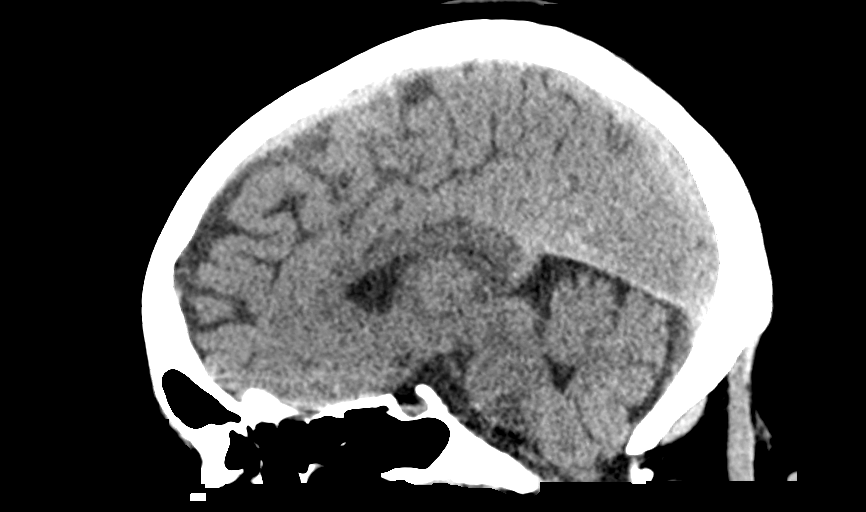
[im 39/59  brain]
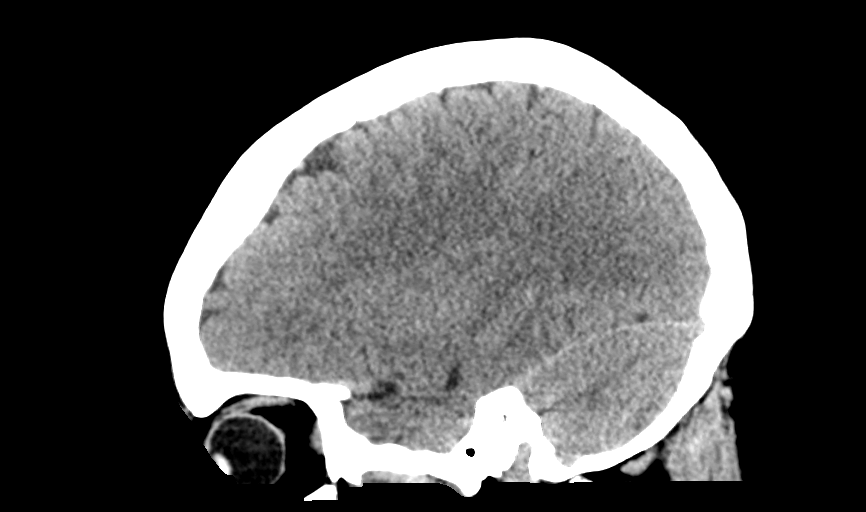

[14 of 47 positions shown; findings below may reference images not displayed]

FINDINGS: Brain: The ventricles are normal in size and configuration. There is
no intracranial mass, hemorrhage, extra-axial fluid collection, or
midline shift. Gray-white compartments are normal. No acute infarct
evident.

Vascular: There is no appreciable hyperdense vessel. There is no
appreciable vascular calcification.

Skull: Bony calvarium appears intact.

Sinuses/Orbits: There is slight mucosal thickening in several
ethmoid air cells. Other paranasal sinuses which are visualized
clear. Orbits appear symmetric bilaterally.

Other: Mastoid air cells are clear.
IMPRESSION: Slight ethmoid sinus disease. No intracranial mass, hemorrhage, or
extra-axial fluid collection. Gray-white compartments appear normal.
